# Patient Record
Sex: Female | Born: 1965 | Hispanic: Refuse to answer
Health system: Western US, Academic
[De-identification: ages and names within clinical notes are randomized; demographics above are authoritative.]

## PROBLEM LIST (undated history)

## (undated) HISTORY — PX: HERNIA REPAIR: SHX51

## (undated) HISTORY — PX: THYROID SURGERY: SHX805

## (undated) HISTORY — PX: ROTATOR CUFF REPAIR: SHX139

## (undated) HISTORY — PX: TONSILLECTOMY: SUR1361

---

## 2001-08-21 ENCOUNTER — Encounter: Payer: Self-pay | Admitting: Emergency Medicine

## 2001-08-21 ENCOUNTER — Emergency Department (HOSPITAL_COMMUNITY): Admission: EM | Admit: 2001-08-21 | Discharge: 2001-08-21 | Payer: Self-pay | Admitting: Emergency Medicine

## 2006-04-20 ENCOUNTER — Emergency Department (HOSPITAL_COMMUNITY): Admission: EM | Admit: 2006-04-20 | Discharge: 2006-04-20 | Payer: Self-pay | Admitting: Emergency Medicine

## 2006-12-29 ENCOUNTER — Emergency Department (HOSPITAL_COMMUNITY): Admission: EM | Admit: 2006-12-29 | Discharge: 2006-12-29 | Payer: Self-pay | Admitting: Emergency Medicine

## 2007-07-16 ENCOUNTER — Emergency Department (HOSPITAL_COMMUNITY): Admission: EM | Admit: 2007-07-16 | Discharge: 2007-07-16 | Payer: Self-pay | Admitting: Emergency Medicine

## 2010-12-11 LAB — URINALYSIS, ROUTINE W REFLEX MICROSCOPIC
Bilirubin Urine: NEGATIVE
Glucose, UA: NEGATIVE
Ketones, ur: NEGATIVE
Nitrite: NEGATIVE
Protein, ur: NEGATIVE
Specific Gravity, Urine: 1.016
Urobilinogen, UA: 1
pH: 6

## 2010-12-11 LAB — URINE MICROSCOPIC-ADD ON

## 2010-12-11 LAB — POCT PREGNANCY, URINE
Operator id: 272551
Preg Test, Ur: NEGATIVE

## 2011-03-05 ENCOUNTER — Emergency Department (HOSPITAL_COMMUNITY): Payer: No Typology Code available for payment source

## 2011-03-05 ENCOUNTER — Emergency Department (HOSPITAL_COMMUNITY)
Admission: EM | Admit: 2011-03-05 | Discharge: 2011-03-05 | Disposition: A | Discharge status: Home / Self Care | Payer: No Typology Code available for payment source | Attending: Emergency Medicine | Admitting: Emergency Medicine

## 2011-03-05 DIAGNOSIS — M25562 Pain in left knee: Secondary | ICD-10-CM

## 2011-03-05 DIAGNOSIS — M25569 Pain in unspecified knee: Secondary | ICD-10-CM | POA: Insufficient documentation

## 2011-03-05 MED ORDER — HYDROCODONE-ACETAMINOPHEN 5-325 MG PO TABS: 1.0000 | ORAL_TABLET | Freq: Four times a day (QID) | ORAL | Status: AC | PRN

## 2011-03-05 MED ORDER — NAPROXEN 500 MG PO TABS: 500.0000 mg | ORAL_TABLET | Freq: Two times a day (BID) | ORAL | Status: AC
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
## 2012-02-01 ENCOUNTER — Emergency Department (INDEPENDENT_AMBULATORY_CARE_PROVIDER_SITE_OTHER)
Admission: EM | Admit: 2012-02-01 | Discharge: 2012-02-01 | Disposition: A | Discharge status: Home / Self Care | Payer: Self-pay | Source: Home / Self Care | Attending: Family Medicine | Admitting: Family Medicine

## 2012-02-01 ENCOUNTER — Encounter (HOSPITAL_COMMUNITY): Payer: Self-pay

## 2012-02-01 DIAGNOSIS — H5710 Ocular pain, unspecified eye: Secondary | ICD-10-CM

## 2012-02-01 MED ORDER — TETRACAINE HCL 0.5 % OP SOLN
OPHTHALMIC | Status: AC
  Filled 2012-02-01: qty 2

## 2012-02-01 NOTE — ED Provider Notes (Signed)
History     CSN: [id number]  Arrival date & time 02/01/12  8295   First MD Initiated Contact with Patient 02/01/12 1024      Chief Complaint  Patient presents with  . Eye Problem    (Consider location/radiation/quality/duration/timing/severity/associated sxs/prior treatment) HPI Comments: 46 year old female with no significant past medical history. Here complaining of left eye pain and photophobia since last night. Patient reports that she spent a lot of time yesterday in the computer doing schoolwork and started to develop sensitivity to light, eye tearing and eye pain, you are softer during the evening.  Does not wear contacts. Denies history of glaucoma or eye disease. Denies trauma, injury or history of foreign body. Denies purulent drainage. Patient states she cannot open her eye due to severe pain.    History reviewed. No pertinent past medical history.  Past Surgical History  Procedure Date  . Rotator cuff repair   . Thyroid surgery   . Tonsillectomy   . Hernia repair     No family history on file.  History  Substance Use Topics  . Smoking status: Never Smoker   . Smokeless tobacco: Not on file  . Alcohol Use: No    OB History    Grav Para Term Preterm Abortions TAB SAB Ect Mult Living                  Review of Systems  Constitutional: Negative for fever, chills and appetite change.  HENT: Negative for congestion and rhinorrhea.   Eyes: Positive for photophobia, pain, discharge and redness.  Respiratory: Negative for cough.   Neurological: Negative for dizziness and headaches.  All other systems reviewed and are negative.    Allergies  Review of patient's allergies indicates no known allergies.  Home Medications   Current Outpatient Rx  Name  Route  Sig  Dispense  Refill  . NAPROXEN 500 MG PO TABS   Oral   Take 1 tablet (500 mg total) by mouth 2 (two) times daily.   14 tablet   0     There were no vitals taken for this visit.  Physical  Exam  Nursing note and vitals reviewed. Constitutional: She is oriented to person, place, and time. She appears well-developed and well-nourished.       Unable to open left eye due to pain and photofobia light has been turned off.   HENT:  Head: Normocephalic and atraumatic.  Right Ear: External ear normal.  Left Ear: External ear normal.  Nose: Nose normal.  Mouth/Throat: Oropharynx is clear and moist. No oropharyngeal exudate.  Eyes: No scleral icterus.       Right eye normal. Left eye: severe photophobia, patient refuses to open eye due to pain despite light being dimmed. Persistent tearing. Uncomfortable with minimal eye opening even after tetracaine drops administration.   Reported pain with ocular digital pressure. Miotic reactive pupil but reported painful pupil reaction. Conjunctival erythema that also involves peri cilliar area. No exudates. No ulcers or abrasions on fluorescein stain. No periorbital erythema, edema or fluctuations.    Cardiovascular: Normal heart sounds.   Pulmonary/Chest: Breath sounds normal.  Lymphadenopathy:    She has no cervical adenopathy.  Neurological: She is alert and oriented to person, place, and time.  Skin: No rash noted. She is not diaphoretic.    ED Course  Procedures (including critical care time)  Labs Reviewed - No data to display No results found.   1. Eye pain  MDM  46 year old female here with conjunctival injection including area around iris. Severe photophobia and eye pain even after tetracaine. Possible iritis versus uveitis versus process with increase ocular pressure. Contacted ophthalmologist on call, he is able to see patient in today after discharged from here. Patient agreeable to followup with the ophthalmologist referral/contact information provided.        Sharin Grave, MD 02/03/12 [phone]

## 2012-02-01 NOTE — ED Notes (Signed)
Eye irritation, drainage, sore and swollen, started yesterday., patient states that she is sensitive to light and that she was on the computer all day yesterday doing school work, states that she also had her hair done and that  may be cause as well, tried warm compresses

## 2013-12-19 ENCOUNTER — Emergency Department (HOSPITAL_COMMUNITY): Payer: No Typology Code available for payment source

## 2013-12-19 ENCOUNTER — Emergency Department (HOSPITAL_COMMUNITY)
Admission: EM | Admit: 2013-12-19 | Discharge: 2013-12-19 | Disposition: A | Discharge status: Home / Self Care | Payer: No Typology Code available for payment source | Attending: Emergency Medicine | Admitting: Emergency Medicine

## 2013-12-19 ENCOUNTER — Encounter (HOSPITAL_COMMUNITY): Payer: Self-pay | Admitting: Emergency Medicine

## 2013-12-19 DIAGNOSIS — S4992XA Unspecified injury of left shoulder and upper arm, initial encounter: Secondary | ICD-10-CM | POA: Insufficient documentation

## 2013-12-19 DIAGNOSIS — S39012A Strain of muscle, fascia and tendon of lower back, initial encounter: Secondary | ICD-10-CM | POA: Insufficient documentation

## 2013-12-19 DIAGNOSIS — Y9389 Activity, other specified: Secondary | ICD-10-CM | POA: Diagnosis not present

## 2013-12-19 DIAGNOSIS — Y92410 Unspecified street and highway as the place of occurrence of the external cause: Secondary | ICD-10-CM | POA: Insufficient documentation

## 2013-12-19 DIAGNOSIS — S161XXA Strain of muscle, fascia and tendon at neck level, initial encounter: Secondary | ICD-10-CM | POA: Insufficient documentation

## 2013-12-19 DIAGNOSIS — S199XXA Unspecified injury of neck, initial encounter: Secondary | ICD-10-CM | POA: Diagnosis present

## 2013-12-19 DIAGNOSIS — S6991XA Unspecified injury of right wrist, hand and finger(s), initial encounter: Secondary | ICD-10-CM | POA: Diagnosis not present

## 2013-12-19 DIAGNOSIS — Z3202 Encounter for pregnancy test, result negative: Secondary | ICD-10-CM | POA: Diagnosis not present

## 2013-12-19 DIAGNOSIS — M79644 Pain in right finger(s): Secondary | ICD-10-CM

## 2013-12-19 LAB — POC URINE PREG, ED: PREG TEST UR: NEGATIVE

## 2013-12-19 MED ORDER — OXYCODONE-ACETAMINOPHEN 5-325 MG PO TABS
1.0000 | ORAL_TABLET | Freq: Once | ORAL | Status: AC
  Administered 2013-12-19: 1 via ORAL
  Filled 2013-12-19: qty 1

## 2013-12-19 NOTE — ED Notes (Signed)
Declined W/C at D/C and was escorted to lobby by RN. 

## 2013-12-19 NOTE — Progress Notes (Signed)
Orthopedic Tech Progress Note Patient Details:  Carolyn LimingJeanetta Y Huang Aug 17, 1965 [id number]  Ortho Devices Type of Ortho Device: Thumb velcro splint Ortho Device/Splint Interventions: Application   Haskell Flirtewsome, Serafina Topham M 12/19/2013, 11:36 PM
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
## 2013-12-19 NOTE — Discharge Instructions (Signed)
Please call your doctor for a followup appointment within 24-48 hours. When you talk to your doctor please let them know that you were seen in the emergency department and have them acquire all of your records so that they can discuss the findings with you and formulate a treatment plan to fully care for your new and ongoing problems. Please call and set up an appointment with your primary care provider Please call and set up an appointment with orthopedics Please rest and stay hydrated Please keep wrist in brace at all times for comfort purposes Please massage and apply heat to aid in muscular relief Please continue to monitor symptoms closely and if symptoms are to worsen or change (fever greater than 101, chills, sweating, nausea, vomiting, chest pain, shortness of breathe, difficulty breathing, weakness, numbness, tingling, worsening or changes to pain pattern, fall, injury, inability to control urine or bowel movements) please report back to the Emergency Department immediately.   Cervical Sprain A cervical sprain is when the tissues (ligaments) that hold the neck bones in place stretch or tear. HOME CARE   Put ice on the injured area.  Put ice in a plastic bag.  Place a towel between your skin and the bag.  Leave the ice on for 15-20 minutes, 3-4 times a day.  You may have been given a collar to wear. This collar keeps your neck from moving while you heal.  Do not take the collar off unless told by your doctor.  If you have long hair, keep it outside of the collar.  Ask your doctor before changing the position of your collar. You may need to change its position over time to make it more comfortable.  If you are allowed to take off the collar for cleaning or bathing, follow your doctor's instructions on how to do it safely.  Keep your collar clean by wiping it with mild soap and water. Dry it completely. If the collar has removable pads, remove them every 1-2 days to hand wash them  with soap and water. Allow them to air dry. They should be dry before you wear them in the collar.  Do not drive while wearing the collar.  Only take medicine as told by your doctor.  Keep all doctor visits as told.  Keep all physical therapy visits as told.  Adjust your work station so that you have good posture while you work.  Avoid positions and activities that make your problems worse.  Warm up and stretch before being active. GET HELP IF:  Your pain is not controlled with medicine.  You cannot take less pain medicine over time as planned.  Your activity level does not improve as expected. GET HELP RIGHT AWAY IF:   You are bleeding.  Your stomach is upset.  You have an allergic reaction to your medicine.  You develop new problems that you cannot explain.  You lose feeling (become numb) or you cannot move any part of your body (paralysis).  You have tingling or weakness in any part of your body.  Your symptoms get worse. Symptoms include:  Pain, soreness, stiffness, puffiness (swelling), or a burning feeling in your neck.  Pain when your neck is touched.  Shoulder or upper back pain.  Limited ability to move your neck.  Headache.  Dizziness.  Your hands or arms feel week, lose feeling, or tingle.  Muscle spasms.  Difficulty swallowing or chewing. MAKE SURE YOU:   Understand these instructions.  Will watch your condition.  Will get help right away if you are not doing well or get worse. Document Released: 08/06/2007 Document Revised: 10/20/2012 Document Reviewed: 08/25/2012 [facility] Patient Information 2015 [place], [place]. This information is not intended to replace advice given to you by your health care provider. Make sure you discuss any questions you have with your health care provider.  Lumbosacral Strain Lumbosacral strain is a strain of any of the parts that make up your lumbosacral vertebrae. Your lumbosacral vertebrae are the bones that  make up the lower third of your backbone. Your lumbosacral vertebrae are held together by muscles and tough, fibrous tissue (ligaments).  CAUSES  A sudden blow to your back can cause lumbosacral strain. Also, anything that causes an excessive stretch of the muscles in the low back can cause this strain. This is typically seen when people exert themselves strenuously, fall, lift heavy objects, bend, or crouch repeatedly. RISK FACTORS  Physically demanding work.  Participation in pushing or pulling sports or sports that require a sudden twist of the back (tennis, golf, baseball).  Weight lifting.  Excessive lower back curvature.  Forward-tilted pelvis.  Weak back or abdominal muscles or both.  Tight hamstrings. SIGNS AND SYMPTOMS  Lumbosacral strain may cause pain in the area of your injury or pain that moves (radiates) down your leg.  DIAGNOSIS Your health care provider can often diagnose lumbosacral strain through a physical exam. In some cases, you may need tests such as X-ray exams.  TREATMENT  Treatment for your lower back injury depends on many factors that your clinician will have to evaluate. However, most treatment will include the use of anti-inflammatory medicines. HOME CARE INSTRUCTIONS   Avoid hard physical activities (tennis, racquetball, waterskiing) if you are not in proper physical condition for it. This may aggravate or create problems.  If you have a back problem, avoid sports requiring sudden body movements. Swimming and walking are generally safer activities.  Maintain good posture.  Maintain a healthy weight.  For acute conditions, you may put ice on the injured area.  Put ice in a plastic bag.  Place a towel between your skin and the bag.  Leave the ice on for 20 minutes, 2-3 times a day.  When the low back starts healing, stretching and strengthening exercises may be recommended. SEEK MEDICAL CARE IF:  Your back pain is getting worse.  You  experience severe back pain not relieved with medicines. SEEK IMMEDIATE MEDICAL CARE IF:   You have numbness, tingling, weakness, or problems with the use of your arms or legs.  There is a change in bowel or bladder control.  You have increasing pain in any area of the body, including your belly (abdomen).  You notice shortness of breath, dizziness, or feel faint.  You feel sick to your stomach (nauseous), are throwing up (vomiting), or become sweaty.  You notice discoloration of your toes or legs, or your feet get very cold. MAKE SURE YOU:   Understand these instructions.  Will watch your condition.  Will get help right away if you are not doing well or get worse. Document Released: 11/27/2004 Document Revised: 02/22/2013 Document Reviewed: 10/06/2012 [facility] Patient Information 2015 [place], [place]. This information is not intended to replace advice given to you by your health care provider. Make sure you discuss any questions you have with your health care provider.

## 2013-12-22 NOTE — ED Provider Notes (Signed)
Medical screening examination/treatment/procedure(s) were performed by non-physician practitioner and as supervising physician I was immediately available for consultation/collaboration.   EKG Interpretation None       Nazarene Bunning, MD 12/22/13 0803 

## 2015-12-28 ENCOUNTER — Encounter (HOSPITAL_COMMUNITY): Payer: Self-pay | Admitting: Emergency Medicine

## 2015-12-28 ENCOUNTER — Emergency Department (HOSPITAL_COMMUNITY): Payer: Self-pay

## 2015-12-28 ENCOUNTER — Emergency Department (HOSPITAL_COMMUNITY)
Admission: EM | Admit: 2015-12-28 | Discharge: 2015-12-28 | Disposition: A | Discharge status: Home / Self Care | Payer: Self-pay | Attending: Emergency Medicine | Admitting: Emergency Medicine

## 2015-12-28 DIAGNOSIS — Y9389 Activity, other specified: Secondary | ICD-10-CM | POA: Insufficient documentation

## 2015-12-28 DIAGNOSIS — T148XXA Other injury of unspecified body region, initial encounter: Secondary | ICD-10-CM

## 2015-12-28 DIAGNOSIS — Y999 Unspecified external cause status: Secondary | ICD-10-CM | POA: Insufficient documentation

## 2015-12-28 DIAGNOSIS — Z79899 Other long term (current) drug therapy: Secondary | ICD-10-CM | POA: Insufficient documentation

## 2015-12-28 DIAGNOSIS — S86811A Strain of other muscle(s) and tendon(s) at lower leg level, right leg, initial encounter: Secondary | ICD-10-CM | POA: Insufficient documentation

## 2015-12-28 DIAGNOSIS — X500XXA Overexertion from strenuous movement or load, initial encounter: Secondary | ICD-10-CM | POA: Insufficient documentation

## 2015-12-28 DIAGNOSIS — M79604 Pain in right leg: Secondary | ICD-10-CM

## 2015-12-28 DIAGNOSIS — Y929 Unspecified place or not applicable: Secondary | ICD-10-CM | POA: Insufficient documentation

## 2015-12-28 LAB — CBC WITH DIFFERENTIAL/PLATELET
Basophils Absolute: 0 10*3/uL (ref 0.0–0.1)
Basophils Relative: 1 %
Eosinophils Absolute: 0.2 10*3/uL (ref 0.0–0.7)
Eosinophils Relative: 5 %
HEMATOCRIT: 32 % — AB (ref 36.0–46.0)
HEMOGLOBIN: 9.8 g/dL — AB (ref 12.0–15.0)
LYMPHS ABS: 1.2 10*3/uL (ref 0.7–4.0)
LYMPHS PCT: 32 %
MCH: 26 pg (ref 26.0–34.0)
MCHC: 30.6 g/dL (ref 30.0–36.0)
MCV: 84.9 fL (ref 78.0–100.0)
MONOS PCT: 9 %
Monocytes Absolute: 0.3 10*3/uL (ref 0.1–1.0)
NEUTROS ABS: 1.9 10*3/uL (ref 1.7–7.7)
NEUTROS PCT: 53 %
Platelets: 255 10*3/uL (ref 150–400)
RBC: 3.77 MIL/uL — AB (ref 3.87–5.11)
RDW: 16.3 % — ABNORMAL HIGH (ref 11.5–15.5)
WBC: 3.6 10*3/uL — ABNORMAL LOW (ref 4.0–10.5)

## 2015-12-28 LAB — BASIC METABOLIC PANEL
Anion gap: 5 (ref 5–15)
BUN: 15 mg/dL (ref 6–20)
CHLORIDE: 107 mmol/L (ref 101–111)
CO2: 24 mmol/L (ref 22–32)
CREATININE: 0.88 mg/dL (ref 0.44–1.00)
Calcium: 8.9 mg/dL (ref 8.9–10.3)
GFR calc Af Amer: >60 mL/min (ref >60)
GFR calc non Af Amer: >60 mL/min (ref >60)
Glucose, Bld: 84 mg/dL (ref 65–99)
POTASSIUM: 3.5 mmol/L (ref 3.5–5.1)
Sodium: 136 mmol/L (ref 135–145)

## 2015-12-28 LAB — I-STAT BETA HCG BLOOD, ED (MC, WL, AP ONLY): I-stat hCG, quantitative: <5 m[IU]/mL (ref <5)

## 2015-12-28 MED ORDER — NAPROXEN 250 MG PO TABS
500.0000 mg | ORAL_TABLET | Freq: Once | ORAL | Status: AC
  Administered 2015-12-28: 500 mg via ORAL
  Filled 2015-12-28: qty 2

## 2015-12-28 NOTE — ED Provider Notes (Signed)
[facility]-EMERGENCY DEPT Provider Note   CSN: [id number] Arrival date & time: 12/28/15  1420     History   Chief Complaint Chief Complaint  Patient presents with  . Leg Pain    HPI Carolyn Huang is a 50 y.o. female.  HPI  50 year old female who presents with right lower extremity pain. She is otherwise healthy with history of thyroidectomy and hernia repair. Shee states that for the past several days she has had right lower extremity leg pain. Initially in the back of her heel, but gradually involving her calf and posterior thigh. Pain exacerbated with dorsi and plantar flexion of her ankle. Has noted some mild swelling of the left calf. Recently has been on a 2 hour car ride, but no other prolonged immobilization. No trauma or fall, but states that she is on her feet a lot and ambulates a lot at baseline. He has not taking any medications for her pain. No history or family history of PE/DVT, exogenous hormones, recent surgeries. No overlying skin changes, fevers or chills, chest pain or difficulty breathing, syncope or near syncope.  History reviewed. No pertinent past medical history.  There are no active problems to display for this patient.   Past Surgical History:  Procedure Laterality Date  . HERNIA REPAIR    . ROTATOR CUFF REPAIR    . THYROID SURGERY    . TONSILLECTOMY      OB History    No data available       Home Medications    Prior to Admission medications   Medication Sig Start Date End Date Taking? Authorizing Provider  levothyroxine (SYNTHROID, LEVOTHROID) 175 MCG tablet 1 tablet daily. 11/11/15  Yes Historical Provider, MD    Family History No family history on file.  Social History Social History  Substance Use Topics  . Smoking status: Never Smoker  . Smokeless tobacco: Never Used  . Alcohol use No     Allergies   Review of patient's allergies indicates no known allergies.   Review of Systems Review of Systems 10/14 systems reviewed  and are negative other than those stated in the HPI   Physical Exam Updated Vital Signs BP 146/64 (BP Location: Left Arm)   Pulse 83   Temp 98.3 F (36.8 C) (Oral)   Resp 20   Ht 5\' 3"  (1.6 m)   Wt 170 lb (77.1 kg)   LMP 12/02/2015   SpO2 100%   BMI 30.11 kg/m   Physical Exam Physical Exam  Nursing note and vitals reviewed. Constitutional: Well developed, well nourished, non-toxic, and in no acute distress Head: Normocephalic and atraumatic.  Mouth/Throat: Oropharynx is clear and moist.  Neck: Normal range of motion. Neck supple.  Cardiovascular: Normal rate and regular rhythm.   +2 DP pulses bilaterally Pulmonary/Chest: Effort normal and breath sounds normal.  Abdominal: Soft. There is no tenderness. There is no rebound and no guarding.  Musculoskeletal: Normal range of motion of bilateral lower extremities. No reproducible tenderness to palpation of the calf. No significant soft tissue swelling or overlying skin changes. No deformities.  Neurological: Alert, no facial droop, fluent speech, moves all extremities symmetrically, full strength in hip flexion/extension, knee flexion/extension, and ankle dorsi/plantar flexion bilaterally, sensation to light touch intact bilateral lower extremities Skin: Skin is warm and dry.  Psychiatric: Cooperative   ED Treatments / Results  Labs (all labs ordered are listed, but only abnormal results are displayed) Labs Reviewed  CBC WITH DIFFERENTIAL/PLATELET - Abnormal; Notable for the following:  Result Value   WBC 3.6 (*)    RBC 3.77 (*)    Hemoglobin 9.8 (*)    HCT 32.0 (*)    RDW 16.3 (*)    All other components within normal limits  BASIC METABOLIC PANEL  I-STAT BETA HCG BLOOD, ED ([facility], [facility], [facility] ONLY)    EKG  EKG Interpretation None       Radiology [place] Venous Img Lower Unilateral Right  Result Date: 12/28/2015 CLINICAL DATA:  Right calf and posterior thigh pain for 1 week. EXAM: RIGHT LOWER EXTREMITY VENOUS DOPPLER  ULTRASOUND TECHNIQUE: Gray-scale sonography with graded compression, as well as color Doppler and duplex ultrasound, were performed to evaluate the deep venous system from the level of the common femoral vein through the popliteal and proximal calf veins. Spectral Doppler was utilized to evaluate flow at rest and with distal augmentation maneuvers. COMPARISON:  None. FINDINGS: Left common femoral vein is patent without thrombus. Normal compressibility, augmentation and color Doppler flow in the right common femoral vein, right femoral vein and right popliteal vein. The right saphenofemoral junction is patent. Right profunda femoral vein is patent without thrombus. Visualized right deep calf veins are patent without thrombus. IMPRESSION: Negative for deep venous thrombosis in right lower extremity. Electronically Signed   By: Richarda OverlieAdam  Henn M.D.   On: 12/28/2015 15:52    Procedures Procedures (including critical care time)  Medications Ordered in ED Medications  naproxen (NAPROSYN) tablet 500 mg (500 mg Oral Given 12/28/15 1554)     Initial Impression / Assessment and Plan / ED Course  I have reviewed the triage vital signs and the nursing notes.  Pertinent labs & imaging results that were available during my care of the patient were reviewed by me and considered in my medical decision making (see chart for details).  Clinical Course    50 year old female who presents with right calf and posterior thigh tenderness. Will r/o DVT although low risk. Seems like MSK strain. US of RLE is negative for DVT. No overlying skin changes to suggest cellulitis or infection. Discussed supportive care management for likely muscle strain/musculoskeletal pain. Strict return and follow-up instructions reviewed. She expressed understanding of all discharge instructions and felt comfortable with the plan of care.   Final Clinical Impressions(s) / ED Diagnoses   Final diagnoses:  Right leg pain  Muscle strain     New Prescriptions New Prescriptions   No medications on file     Lavera Guiseana Duo Braedon Sjogren, MD 12/28/15 1655

## 2015-12-28 NOTE — ED Triage Notes (Signed)
Pt c/o rle pain x 1 week. Denies injury. Pt c/o worse pain with flexion and extension of foot. Denies redness/swelling.

## 2015-12-28 NOTE — Discharge Instructions (Signed)
The pain in your leg seems most consistent with muscle strain at this time.  Take anti-inflammatory medications, such as ibuprofen OR naproxen as needed for pain. Keep leg elevated at rest.  Return without fail for worsening symptoms, including fever, escalating pain, inability to walk, or any other symptoms concerning to you.

## 2016-01-06 ENCOUNTER — Encounter (HOSPITAL_COMMUNITY): Payer: Self-pay | Admitting: *Deleted

## 2016-01-06 ENCOUNTER — Emergency Department (HOSPITAL_COMMUNITY)
Admission: EM | Admit: 2016-01-06 | Discharge: 2016-01-06 | Disposition: A | Discharge status: Home / Self Care | Payer: Self-pay | Attending: Emergency Medicine | Admitting: Emergency Medicine

## 2016-01-06 DIAGNOSIS — D649 Anemia, unspecified: Secondary | ICD-10-CM | POA: Insufficient documentation

## 2016-01-06 DIAGNOSIS — M79661 Pain in right lower leg: Secondary | ICD-10-CM | POA: Insufficient documentation

## 2016-01-06 MED ORDER — TRAMADOL HCL 50 MG PO TABS: 50.0000 mg | ORAL_TABLET | Freq: Four times a day (QID) | ORAL | 0 refills | Status: DC | PRN

## 2016-01-06 NOTE — ED Provider Notes (Signed)
[facility]-EMERGENCY DEPT Provider Note   CSN: [id number] Arrival date & time: 01/06/16  1954     History   Chief Complaint Chief Complaint  Patient presents with  . Leg Pain    HPI Carolyn Huang is a 50 y.o. female.  She complains of ongoing pain in her right calf. Pain has been present for about 2 weeks. [facility] and was evaluated with blood work and ultrasound which showed no evidence of blood clot or infection. She was advised to take naproxen or ibuprofen. She is actually been taking both. She continues to have pain in her right calf which is worse with walking. She rates pain at 9/10. She denies fever or chills. She denies any chest pain or dyspnea. She has no risk factors for DVT (no recent surgery, no long distance travel, no history of cancer, no exogenous estrogens). She denies any history of trauma. She says that she has to walk on her toes on the right side because of pain.   The history is provided by the patient.  Leg Pain      History reviewed. No pertinent past medical history.  There are no active problems to display for this patient.   Past Surgical History:  Procedure Laterality Date  . HERNIA REPAIR    . ROTATOR CUFF REPAIR    . THYROID SURGERY    . TONSILLECTOMY      OB History    No data available       Home Medications    Prior to Admission medications   Medication Sig Start Date End Date Taking? Authorizing Provider  levothyroxine (SYNTHROID, LEVOTHROID) 175 MCG tablet 1 tablet daily. 11/11/15   Historical Provider, MD  traMADol (ULTRAM) 50 MG tablet Take 1 tablet (50 mg total) by mouth every 6 (six) hours as needed. 01/06/16   Dione Boozeavid Gladys Deckard, MD    Family History No family history on file.  Social History Social History  Substance Use Topics  . Smoking status: Never Smoker  . Smokeless tobacco: Never Used  . Alcohol use No     Allergies   Patient has no known  allergies.   Review of Systems Review of Systems  Endocrine:       Pagophagia  All other systems reviewed and are negative.    Physical Exam Updated Vital Signs BP 128/90 (BP Location: Left Arm)   Pulse 73   Temp 98.3 F (36.8 C) (Oral)   Resp 18   Ht 5\' 3"  (1.6 m)   Wt 180 lb (81.6 kg)   LMP 12/02/2015   SpO2 99%   BMI 31.89 kg/m   Physical Exam  Nursing note and vitals reviewed.  50 year old female, resting comfortably and in no acute distress. Vital signs are normal. Oxygen saturation is 99%, which is normal. Head is normocephalic and atraumatic. PERRLA, EOMI. Oropharynx is clear. Neck is nontender and supple without adenopathy or JVD. Back is nontender and there is no CVA tenderness. Lungs are clear without rales, wheezes, or rhonchi. Chest is nontender. Heart has regular rate and rhythm without murmur. Abdomen is soft, flat, nontender without masses or hepatosplenomegaly and peristalsis is normoactive. Extremities have no cyanosis or edema, full range of motion is present. There is tenderness to palpation over the right calf with maximum tenderness in the area of the popliteal fossa. There is no swelling or deformity. Calf circumference is equal. Homans sign is negative.  There is no erythema or warmth. Skin is warm and dry without rash. Neurologic: Mental status is normal, cranial nerves are intact, there are no motor or sensory deficits.  ED Treatments / Results   Procedures Procedures (including critical care time)  Medications Ordered in ED Medications - No data to display   Initial Impression / Assessment and Plan / ED Course  I have reviewed the triage vital signs and the nursing notes.  Pertinent labs & imaging results that were available during my care of the patient were reviewed by me and considered in my medical decision making (see chart for details).  Clinical Course    Right calf pain which seems most consistent with ruptured Baker's cyst. Old  records are reviewed confirming ED visit on October 27 at which time venous ultrasound was negative for DVT, and WBC was normal. Incidental finding of anemia. Because of her difficulty with gait, she is given crutches. She is advised to continue taking naproxen but to discontinue ibuprofen. Prescriptions given for tramadol for pain. She is referred to orthopedics for follow-up.  Final Clinical Impressions(s) / ED Diagnoses   Final diagnoses:  Pain in right lower leg  Normochromic normocytic anemia    New Prescriptions New Prescriptions   TRAMADOL (ULTRAM) 50 MG TABLET    Take 1 tablet (50 mg total) by mouth every 6 (six) hours as needed.     Dione Boozeavid Skye Rodarte, MD 01/06/16 2056

## 2016-01-06 NOTE — ED Triage Notes (Signed)
Dr Rosalia Hammersray has  Checked the pt in triage  Maybe swollen rt calf

## 2016-01-06 NOTE — ED Triage Notes (Signed)
The pt is c/o rt lower leg pain since last Thursday  No known injury  [facility] she is still having pain

## 2016-01-06 NOTE — ED Notes (Signed)
Attempted to have pt sign discharge instructions however signature pad not working.  Pt verbalized understanding of discharge instructions and prescriptions.

## 2016-01-06 NOTE — Progress Notes (Signed)
Orthopedic Tech Progress Note Patient Details:  Carolyn Huang 08-11-1965 [id number]  Ortho Devices Type of Ortho Device: Crutches Ortho Device/Splint Interventions: Ordered, Application   Carolyn Huang, Carolyn Huang 01/06/2016, 9:59 PM

## 2016-01-06 NOTE — Discharge Instructions (Signed)
Continue to take naproxen - two tablets, twice a day. Do not take ibuprofen. If you need additional pain medication, take acetaminophen and/or tramadol.  Apply ice several times a day.

## 2017-05-30 IMAGING — US US EXTREM LOW VENOUS*R*
1 series · 14 of 24 positions shown · non-contrast
Comparison: None.

CLINICAL DATA: Right calf and posterior thigh pain for 1 week.

EXAM:
RIGHT LOWER EXTREMITY VENOUS DOPPLER ULTRASOUND
TECHNIQUE: Gray-scale sonography with graded compression, as well as color
Doppler and duplex ultrasound, were performed to evaluate the deep
venous system from the level of the common femoral vein through the
popliteal and proximal calf veins. Spectral Doppler was utilized to
evaluate flow at rest and with distal augmentation maneuvers.

[Series 1: us extrem low venous*right* · 0.06mm/px · 14 of 33 slices shown]
[im 1/33]
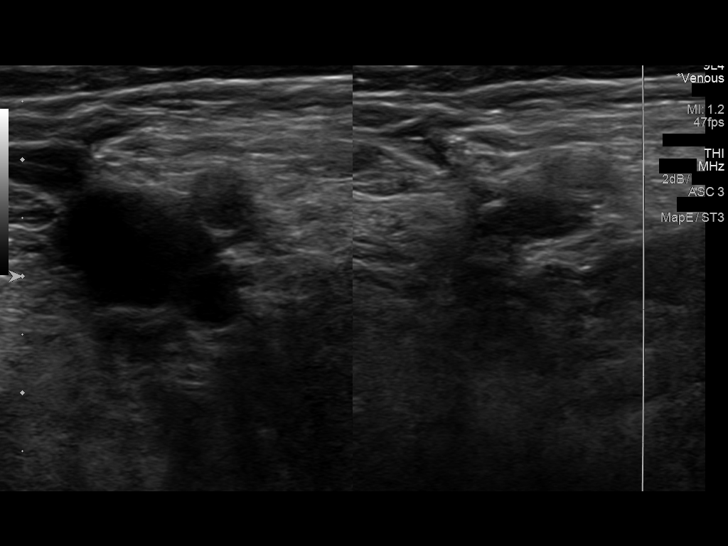
[im 3/33]
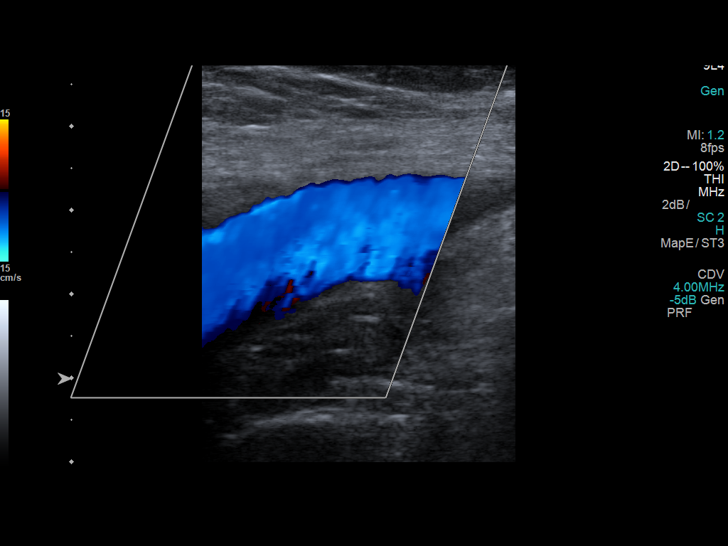
[im 6/33]
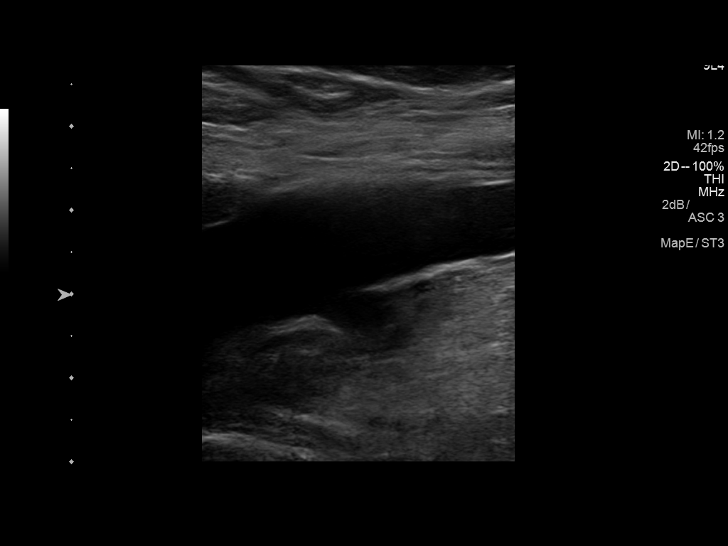
[im 9/33]
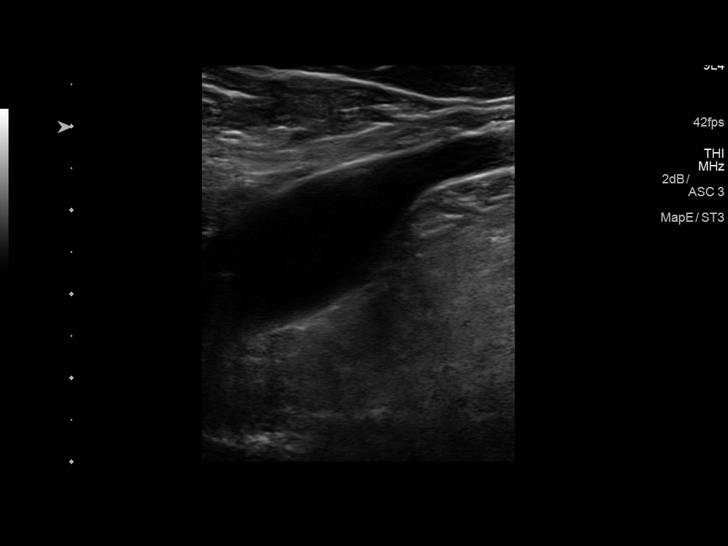
[im 10/33]
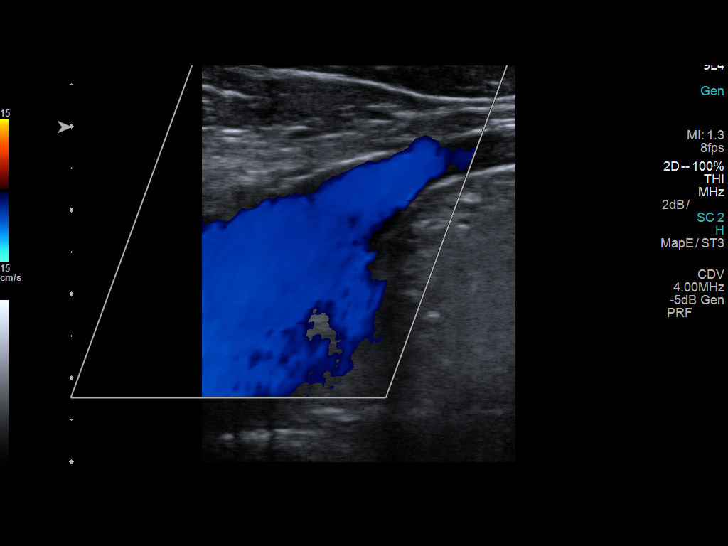
[im 13/33]
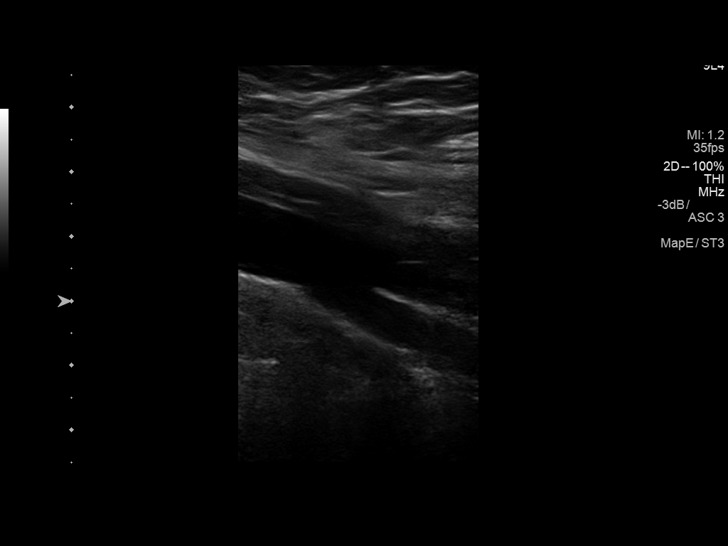
[im 16/33]
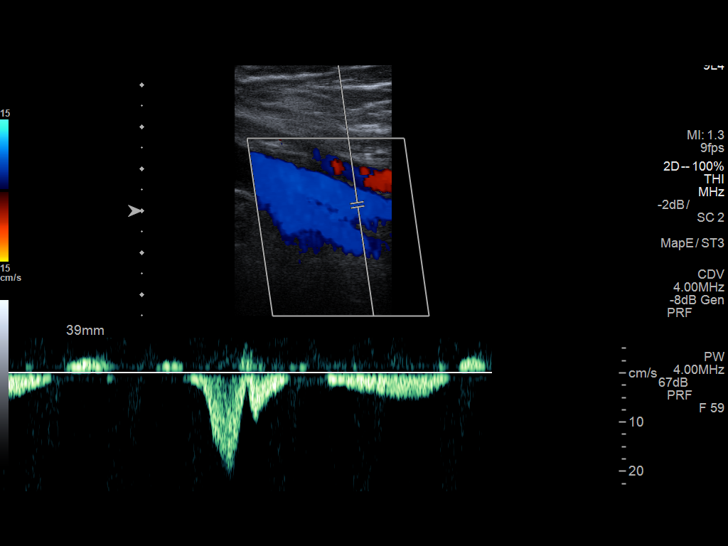
[im 17/33]
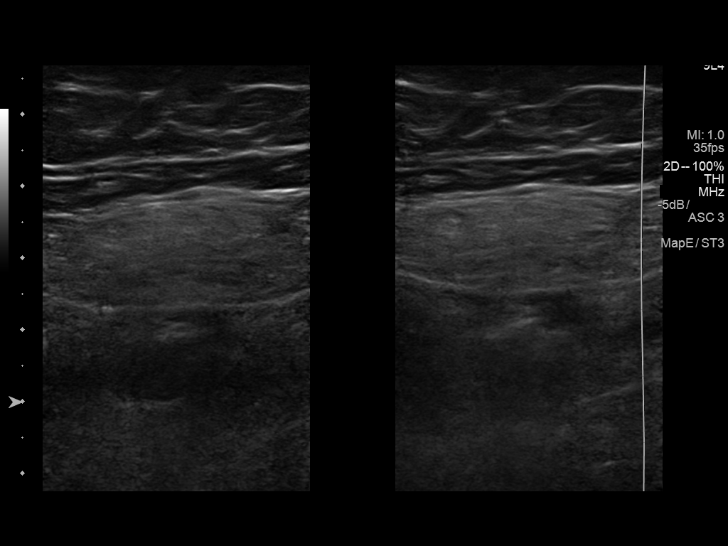
[im 20/33]
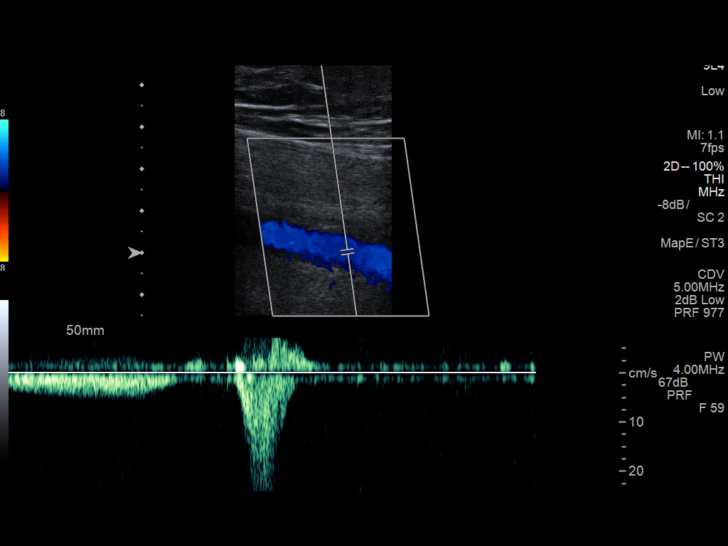
[im 23/33]
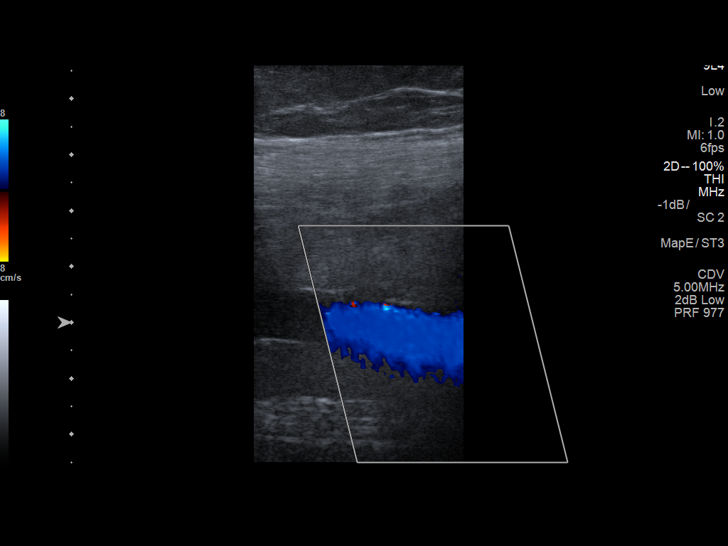
[im 26/33]
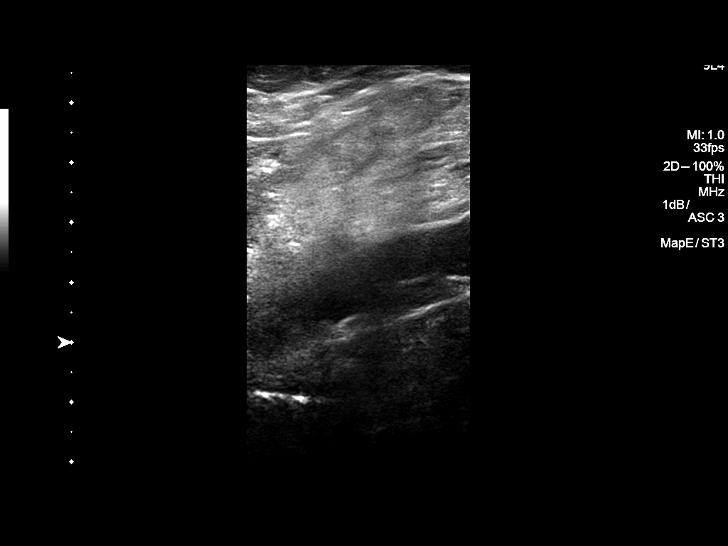
[im 27/33]
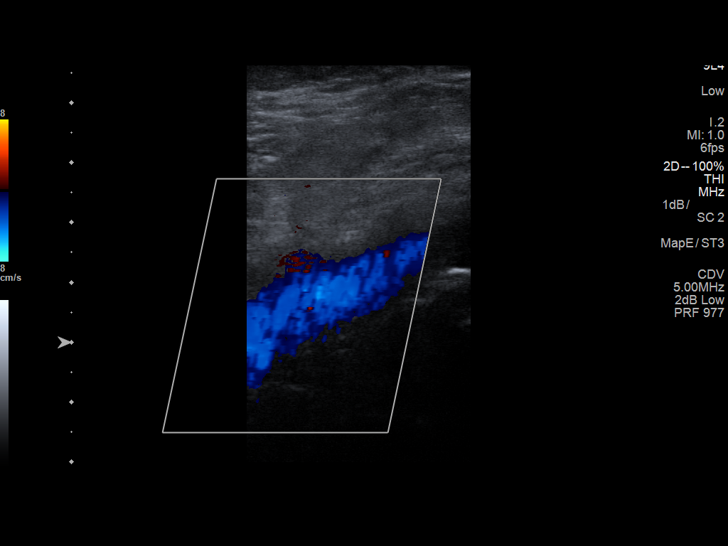
[im 30/33]
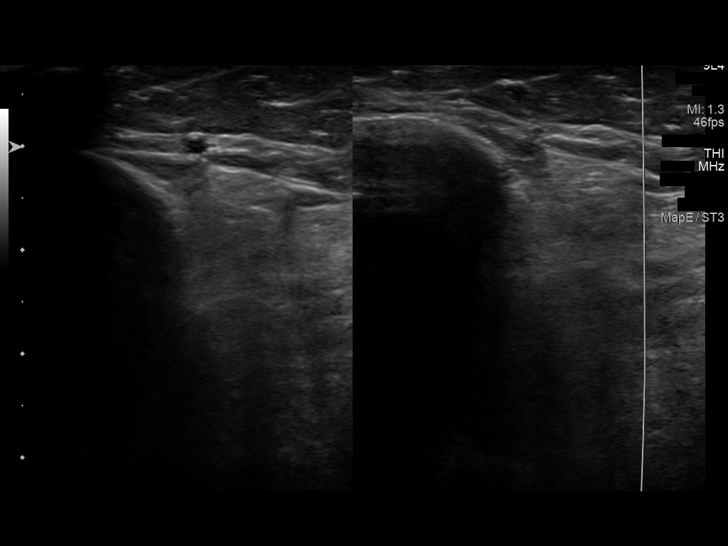
[im 33/33]
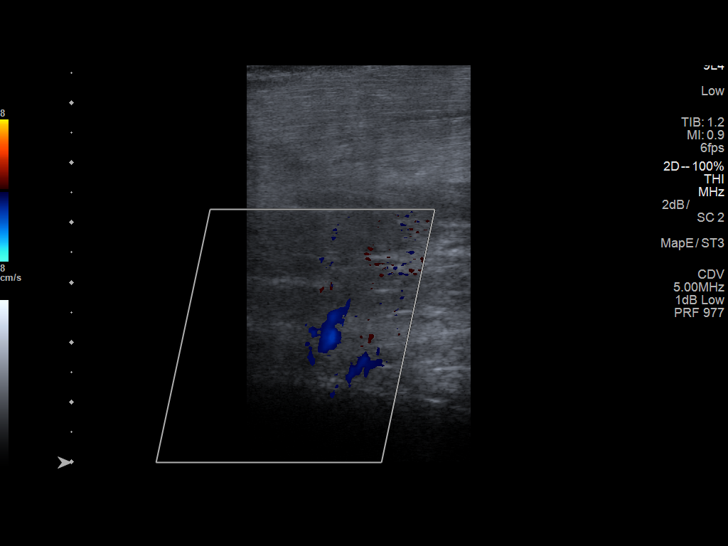

[14 of 24 positions shown; findings below may reference images not displayed]

FINDINGS: Left common femoral vein is patent without thrombus.

Normal compressibility, augmentation and color Doppler flow in the
right common femoral vein, right femoral vein and right popliteal
vein. The right saphenofemoral junction is patent. Right profunda
femoral vein is patent without thrombus. Visualized right deep calf
veins are patent without thrombus.
IMPRESSION: Negative for deep venous thrombosis in right lower extremity.

## 2017-12-06 ENCOUNTER — Emergency Department (HOSPITAL_COMMUNITY)
Admission: EM | Admit: 2017-12-06 | Discharge: 2017-12-07 | Disposition: A | Discharge status: Home / Self Care | Payer: BLUE CROSS/BLUE SHIELD | Attending: Emergency Medicine | Admitting: Emergency Medicine

## 2017-12-06 ENCOUNTER — Emergency Department (HOSPITAL_COMMUNITY): Payer: BLUE CROSS/BLUE SHIELD

## 2017-12-06 ENCOUNTER — Encounter (HOSPITAL_COMMUNITY): Payer: Self-pay | Admitting: Emergency Medicine

## 2017-12-06 DIAGNOSIS — E039 Hypothyroidism, unspecified: Secondary | ICD-10-CM | POA: Diagnosis not present

## 2017-12-06 DIAGNOSIS — R1084 Generalized abdominal pain: Secondary | ICD-10-CM | POA: Diagnosis present

## 2017-12-06 DIAGNOSIS — N946 Dysmenorrhea, unspecified: Secondary | ICD-10-CM

## 2017-12-06 DIAGNOSIS — Z79899 Other long term (current) drug therapy: Secondary | ICD-10-CM | POA: Insufficient documentation

## 2017-12-06 LAB — CBC
HCT: 31.9 % — ABNORMAL LOW (ref 36.0–46.0)
HEMOGLOBIN: 9.4 g/dL — AB (ref 12.0–15.0)
MCH: 25 pg — ABNORMAL LOW (ref 26.0–34.0)
MCHC: 29.5 g/dL — ABNORMAL LOW (ref 30.0–36.0)
MCV: 84.8 fL (ref 78.0–100.0)
PLATELETS: 322 10*3/uL (ref 150–400)
RBC: 3.76 MIL/uL — AB (ref 3.87–5.11)
RDW: 16 % — ABNORMAL HIGH (ref 11.5–15.5)
WBC: 5.5 10*3/uL (ref 4.0–10.5)

## 2017-12-06 LAB — COMPREHENSIVE METABOLIC PANEL
ALT: 9 U/L (ref 0–44)
AST: 14 U/L — AB (ref 15–41)
Albumin: 3.4 g/dL — ABNORMAL LOW (ref 3.5–5.0)
Alkaline Phosphatase: 91 U/L (ref 38–126)
Anion gap: 8 (ref 5–15)
BUN: 12 mg/dL (ref 6–20)
CALCIUM: 8.8 mg/dL — AB (ref 8.9–10.3)
CHLORIDE: 105 mmol/L (ref 98–111)
CO2: 23 mmol/L (ref 22–32)
CREATININE: 0.91 mg/dL (ref 0.44–1.00)
GFR calc non Af Amer: >60 mL/min (ref >60)
Glucose, Bld: 115 mg/dL — ABNORMAL HIGH (ref 70–99)
POTASSIUM: 3.6 mmol/L (ref 3.5–5.1)
SODIUM: 136 mmol/L (ref 135–145)
TOTAL PROTEIN: 7 g/dL (ref 6.5–8.1)
Total Bilirubin: 0.7 mg/dL (ref 0.3–1.2)

## 2017-12-06 LAB — I-STAT BETA HCG BLOOD, ED (MC, WL, AP ONLY)

## 2017-12-06 LAB — LIPASE, BLOOD: LIPASE: 26 U/L (ref 11–51)

## 2017-12-06 MED ORDER — IOHEXOL 300 MG/ML  SOLN
100.0000 mL | Freq: Once | INTRAMUSCULAR | Status: AC | PRN
  Administered 2017-12-06: 100 mL via INTRAVENOUS

## 2017-12-06 MED ORDER — KETOROLAC TROMETHAMINE 15 MG/ML IJ SOLN
15.0000 mg | Freq: Once | INTRAMUSCULAR | Status: AC
  Administered 2017-12-06: 15 mg via INTRAVENOUS
  Filled 2017-12-06: qty 1

## 2017-12-06 NOTE — ED Notes (Signed)
ED Provider at bedside. 

## 2017-12-06 NOTE — ED Triage Notes (Signed)
Pt reports gen abd pain X few hrs. Cramping,sharp, 10/10. Reports episode of diarrhea, denies N/V.

## 2017-12-06 NOTE — ED Provider Notes (Signed)
[facility] EMERGENCY DEPARTMENT Provider Note   CSN: [id number] Arrival date & time: 12/06/17  2148     History   Chief Complaint Chief Complaint  Patient presents with  . Abdominal Pain    HPI Carolyn Huang is a 52 y.o. female with medical history significant for hypothyroidism and anemia presenting for evaluation of abdominal pain.  She was in her usual state of health until about 8 PM when she experienced an acute onset diffuse abdominal pain, rating the pain at 10/10 and describing the pain as " labor pain" she feels like she is having a baby.  She also reports a one-time episode of loose stools but denies nausea, vomiting, fever, chills.  She does state that she began her menstruation this morning but usually does not experience cramps.  She also denies any change in her diet and her last meal was 12 PM when she ate 2 pieces of fish.  She also denies dysuria, urinary frequency or urgency.    History reviewed. No pertinent past medical history.  There are no active problems to display for this patient.   Past Surgical History:  Procedure Laterality Date  . HERNIA REPAIR    . ROTATOR CUFF REPAIR    . THYROID SURGERY    . TONSILLECTOMY       OB History   None      Home Medications    Prior to Admission medications   Medication Sig Start Date End Date Taking? Authorizing Provider  levothyroxine (SYNTHROID, LEVOTHROID) 200 MCG tablet Take 200 mcg by mouth daily. 12/01/17  Yes [provider]  acetaminophen (TYLENOL) 325 MG tablet Take 2 tablets (650 mg total) by mouth every 6 (six) hours as needed for up to 10 days. 12/07/17 12/17/17  Yvette Rack, MD  ibuprofen (ADVIL,MOTRIN) 400 MG tablet Take 1 tablet (400 mg total) by mouth every 6 (six) hours as needed. 12/07/17   Yvette Rack, MD  traMADol (ULTRAM) 50 MG tablet Take 1 tablet (50 mg total) by mouth every 6 (six) hours as needed. Patient not taking: Reported on 12/06/2017 01/06/16    Dione Booze, MD    Family History No family history on file.  Social History Social History   Tobacco Use  . Smoking status: Never Smoker  . Smokeless tobacco: Never Used  Substance Use Topics  . Alcohol use: No  . Drug use: No     Allergies   Patient has no known allergies.   Review of Systems Review of Systems  Constitutional: Negative.   HENT: Negative.   Eyes: Negative.   Respiratory: Negative.   Cardiovascular: Negative.   Gastrointestinal: Positive for abdominal pain. Negative for blood in stool, constipation, diarrhea, nausea and vomiting.  Endocrine: Negative.   Genitourinary: Negative.   Musculoskeletal: Negative.   Neurological: Negative.   Psychiatric/Behavioral: Negative.      Physical Exam Updated Vital Signs BP (!) 126/54   Pulse 64   Temp 97.8 F (36.6 C) (Oral)   Resp 16   LMP 12/06/2017   SpO2 98%   Physical Exam  Constitutional: She appears well-developed and well-nourished.  Non-toxic appearance. She does not appear ill. She appears distressed.  HENT:  Head: Normocephalic and atraumatic.  Cardiovascular: Normal rate, regular rhythm and normal heart sounds. Exam reveals no gallop and no friction rub.  No murmur heard. Pulmonary/Chest: Effort normal and breath sounds normal.  Abdominal: Soft. Normal appearance and bowel sounds are normal. She exhibits no distension.  There is generalized tenderness and tenderness in the epigastric area and suprapubic area. There is no rigidity, no rebound, no guarding and no CVA tenderness.  Neurological: She is alert.  Skin: Skin is warm.  Psychiatric: She has a normal mood and affect. Her behavior is normal.     ED Treatments / Results  Labs (all labs ordered are listed, but only abnormal results are displayed) Labs Reviewed  COMPREHENSIVE METABOLIC PANEL - Abnormal; Notable for the following components:      Result Value   Glucose, Bld 115 (*)    Calcium 8.8 (*)    Albumin 3.4 (*)    AST 14  (*)    All other components within normal limits  CBC - Abnormal; Notable for the following components:   RBC 3.76 (*)    Hemoglobin 9.4 (*)    HCT 31.9 (*)    MCH 25.0 (*)    MCHC 29.5 (*)    RDW 16.0 (*)    All other components within normal limits  LIPASE, BLOOD  URINALYSIS, ROUTINE W REFLEX MICROSCOPIC  I-STAT BETA HCG BLOOD, ED ([facility], [facility], [facility] ONLY)    EKG None  Radiology Ct Abdomen Pelvis W Contrast  Result Date: 12/07/2017 CLINICAL DATA:  Generalized abdominal pain and diarrhea. EXAM: CT ABDOMEN AND PELVIS WITH CONTRAST TECHNIQUE: Multidetector CT imaging of the abdomen and pelvis was performed using the standard protocol following bolus administration of intravenous contrast. CONTRAST:  OMNIPAQUE IOHEXOL 300 MG/ML  SOLN COMPARISON:  None. FINDINGS: Lower chest: No consolidation. Minimal pleural thickening without effusion. Hepatobiliary: No focal liver abnormality is seen. No gallstones, gallbladder wall thickening, or biliary dilatation. Pancreas: 14 mm fat density lesion in the age in the uncinate process of the pancreas may be a pancreatic lipoma or invagination of peripancreatic fat. No suspicious lesion. No ductal dilatation or inflammation. Spleen: Normal in size without focal abnormality. Adrenals/Urinary Tract: Normal adrenal glands. No hydronephrosis or perinephric edema. Homogeneous renal enhancement with symmetric excretion on delayed phase imaging. Urinary bladder is minimally distended, grossly unremarkable. Stomach/Bowel: The stomach is nondistended. No small bowel wall thickening, inflammatory change or obstruction. Formed stool in the ascending colon. The descending and sigmoid colon are nondistended limiting assessment. No definite colonic wall thickening. Regardless, no pericolonic inflammation. Appendix is not confidently visualized. No pericecal or right lower quadrant inflammation. Vascular/Lymphatic: Unremarkable vascular structures. No enlarged abdominal or  pelvic lymph nodes. Reproductive: 15 mm cyst in the left ovary is likely physiologic. Uterus and right ovary are unremarkable. Tampon in the vagina. Other: Small amount of pelvic free fluid. No free air or intra-abdominal abscess. Musculoskeletal: There are no acute or suspicious osseous abnormalities. Facet hypertrophy in the lower lumbar spine. IMPRESSION: No acute findings in the abdomen/pelvis or explanation for diarrhea. The descending and sigmoid colon are decompressed slightly limiting assessment, however no colonic inflammation. Electronically Signed   By: Narda Rutherford M.D.   On: 12/07/2017 00:13    Procedures Procedures (including critical care time)  Medications Ordered in ED Medications  ketorolac (TORADOL) 15 MG/ML injection 15 mg (15 mg Intravenous Given 12/06/17 2342)  iohexol (OMNIPAQUE) 300 MG/ML solution 100 mL (100 mLs Intravenous Contrast Given 12/06/17 2347)     Initial Impression / Assessment and Plan / ED Course  I have reviewed the triage vital signs and the nursing notes.  Pertinent labs & imaging results that were available during my care of the patient were reviewed by me and considered in my medical decision making (see chart  for details).   52 year old woman presented with acute diffuse abdominal pain, describes the pain as cramps in nature.  Denies fevers, chills, nausea, vomiting but does report one episode of loose stools.  On physical exam there is tenderness at the epigastrium and lower abdomen.  She was re-evaluated after receiving IM Toradol 15mg  and reported of significant relief.  Differential diagnosis for this lady includes diverticulitis, colitis, gastroenteritis or dysmenorrhea.  Currently she has no signs of acute abdomen. CT abdomen and pelvis was unremarkable, she remains hemodynamically stable with normal lipase, beta-hCG, CMP.  Final Clinical Impressions(s) / ED Diagnoses   Final diagnoses:  Generalized abdominal pain    ED Discharge  Orders         Ordered    acetaminophen (TYLENOL) 325 MG tablet  Every 6 hours PRN     12/07/17 0020    ibuprofen (ADVIL,MOTRIN) 400 MG tablet  Every 6 hours PRN     12/07/17 0020           Yvette Rack, MD 12/07/17 Durenda Age, MD 12/07/17 0025

## 2017-12-06 NOTE — ED Notes (Signed)
Patient transported to CT 

## 2017-12-07 LAB — URINALYSIS, ROUTINE W REFLEX MICROSCOPIC
BILIRUBIN URINE: NEGATIVE
Glucose, UA: NEGATIVE mg/dL
Ketones, ur: NEGATIVE mg/dL
Leukocytes, UA: NEGATIVE
NITRITE: NEGATIVE
Protein, ur: NEGATIVE mg/dL
SPECIFIC GRAVITY, URINE: 1.02 (ref 1.005–1.030)
pH: 5 (ref 5.0–8.0)

## 2017-12-07 MED ORDER — ACETAMINOPHEN 325 MG PO TABS
650.0000 mg | ORAL_TABLET | Freq: Four times a day (QID) | ORAL | 0 refills | Status: AC | PRN
Start: 2017-12-07 — End: 2017-12-17

## 2017-12-07 MED ORDER — IBUPROFEN 400 MG PO TABS: 400.0000 mg | ORAL_TABLET | Freq: Four times a day (QID) | ORAL | 0 refills | Status: AC | PRN

## 2017-12-07 NOTE — Discharge Instructions (Addendum)
Ms. Caffee,   It was a pleasure taking care of you here at the ED today.  So far all your labs have come back normal and a CT scan took of your bili was normal.  Your pain is most likely due to cramps from your ongoing menstruation and I will discharge you with Tylenol and Motrin.  I would like for you to follow-up with your primary care doctor but if your symptoms start worsening if he start getting lightheaded, dizzy sensation of passing out, abdominal pain that is out of control please report to the ED.  ~Take Care Dr. Dortha Schwalbe

## 2018-01-31 ENCOUNTER — Ambulatory Visit
Admit: 2018-01-31 | Discharge: 2018-01-31 | Disposition: A | Discharge status: Home / Self Care | Payer: Medicaid Other | Source: Other Acute Inpatient Hospital

## 2018-02-01 NOTE — Progress Notes (Signed)
HOSPITAL MEDICINE OUTSIDE HOSPITAL TRANSFER NOTE    Accept Date: 02/01/18    Referring Provider Name: Dr. Neil CrouchWinn  Referring Provider Contact #: [phone]    Referring Facility/Clinic: [facility]    Dx: Vaginal bleeding with DVT found to have uterine mass  Reason for Admit/Transfer: Gyn/Onc  Level of Care: IMU    Brief History:   28F with psychiatric past medical history who [facility] to [facility] found to have DVT and was started on heparin gtt. Hospital course was then complicated by vaginal bleeding and a CT abdomen/pelvis was performed that showed peritoneal mass with query of lung metastases (CT chest was not performed as patient refused). She required FFP, pRBCs, and IVC filter. She experienced episodes vaginal bleeding requiring recurrent transfusion. Gynecology was consulted and recommended transfer for gyn/onc evaluation. Inguinal LN biopsy was performed 4-5 days ago pending pathology results. Some thought that uterine bleeding could be related to fibroids as well. Gynecology recommend transfer for GYN/ONC evaluation. GYN/ONC deferred acceptance as pathology was pending. Patient currently in the ICU and will be downgraded today per primary team. I have deferred accepting the patient at this time. since the patient is still actively in the ICU and would like to ensure that patient can remain stable on the floor for several hours. Also, have asked for primary team to follow up on pathology results as there should be some preliminary data and which patient could directly go to GYN/ONC service.    Patient is not accepted for transfer at this time as above. I have asked the primary team and the transfer center to reach out to us again tomorrow after the above is followed up on.    Desmond LopeNhan Fumi Guadron MD  Division of Hospital Medicine  Pager: [phone]

## 2018-02-06 NOTE — Progress Notes (Addendum)
HOSPITAL MEDICINE OUTSIDE HOSPITAL TRANSFER NOTE    Accept Date: 02/06/18    Referring Provider Name: Dr. Phillips ClimesShyam Patel  Referring Provider Contact #: [phone]    [facility]    Dx: Vaginal bleeding from uterine mass, requiring further workup to r/o malignancy and requiring multiple transfusions  Reason for Admit/Transfer: Gyn/Onc  Level of Care: IMU    Brief History:   69F with hx of homelessness, bipolar, and schizoaffective disorder (stable), being transferred for evaluation of multiple abdominal and lung masses and consolidation. Has an acute R common femoral vein DVT, started on heparin gtt and transitioned to therapeutic Lovenox. However, noted that her baseline anemia was worse, thought to be due to menstrual bleeding. Required multiple blood transfusions. 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 Given this, she had an IVC filter placed and has not been on therapeutic anticoagulation. Last blood transfusion was yesterday. Today's Hgb 7.6. began having vaginal spotting again 2 days ago, leading to a drop her Hgb that then required transfusion. Their Gynecologist recommended Gyn/Onc evaluate the patient. S/p L inguinal node biopsy on 02/01/2018 but was non-diagnostic for malignancy (resulted on 02/04/2018). Has not had a uterine biopsy performed yet, due to scheduling issues.    Currently, pt is on Med-Tele and is hemodynamically stable. Recommended the Transfer Center contact Gyn-Onc for possible transfer, given that the main etiology of her recurrent anemia (requiring transfusions) is likely her uterine mass--which I communicated with the requesting outside physician. Recommend that outside facility obtain uterine biopsy to r/o malignancy and that outside facility consider IR for embolization of uterine mass, if pt continues to bleed. Hospital Medicine is not accepting this patient.    Haley DawnMaryann Leoma Folds, MD, MPH  Hospitalist  Attending

## 2018-05-26 ENCOUNTER — Emergency Department (HOSPITAL_COMMUNITY)
Admission: EM | Admit: 2018-05-26 | Discharge: 2018-05-26 | Disposition: A | Discharge status: Home / Self Care | Payer: PRIVATE HEALTH INSURANCE | Attending: Emergency Medicine | Admitting: Emergency Medicine

## 2018-05-26 ENCOUNTER — Encounter (HOSPITAL_COMMUNITY): Payer: Self-pay | Admitting: Emergency Medicine

## 2018-05-26 ENCOUNTER — Emergency Department (HOSPITAL_COMMUNITY): Payer: PRIVATE HEALTH INSURANCE

## 2018-05-26 ENCOUNTER — Other Ambulatory Visit: Payer: Self-pay

## 2018-05-26 DIAGNOSIS — R69 Illness, unspecified: Secondary | ICD-10-CM

## 2018-05-26 DIAGNOSIS — R05 Cough: Secondary | ICD-10-CM | POA: Diagnosis present

## 2018-05-26 DIAGNOSIS — E039 Hypothyroidism, unspecified: Secondary | ICD-10-CM | POA: Insufficient documentation

## 2018-05-26 DIAGNOSIS — J111 Influenza due to unidentified influenza virus with other respiratory manifestations: Secondary | ICD-10-CM | POA: Insufficient documentation

## 2018-05-26 MED ORDER — ACETAMINOPHEN 325 MG PO TABS
650.0000 mg | ORAL_TABLET | Freq: Once | ORAL | Status: AC | PRN
  Administered 2018-05-26: 650 mg via ORAL
  Filled 2018-05-26: qty 2

## 2018-05-26 NOTE — ED Provider Notes (Signed)
Emergency Department Provider Note   I have reviewed the triage vital signs and the nursing notes.   HISTORY  Chief Complaint Cough and Fever   HPI Carolyn Huang is a 53 y.o. female with PMH of hypothyroidism presents to the ED with cough and fever.  Patient has had 2 days of symptoms.  She denies chest pain or shortness of breath.  She notes people with mild URI symptoms at work but no known sick contacts.  Specifically no COVID contacts. No travel.  Any history of COPD or congestive heart failure.  She was unaware of the fever until it was identified today.  She had one episode of vomiting 4 days ago but that is resolved.  No abdominal pain or diarrhea.  She does have some nasal congestion.  No body aches.  History reviewed. No pertinent past medical history.  There are no active problems to display for this patient.   Past Surgical History:  Procedure Laterality Date  . HERNIA REPAIR    . ROTATOR CUFF REPAIR    . THYROID SURGERY    . TONSILLECTOMY     Allergies Patient has no known allergies.  History reviewed. No pertinent family history.  Social History Social History   Tobacco Use  . Smoking status: Never Smoker  . Smokeless tobacco: Never Used  Substance Use Topics  . Alcohol use: No  . Drug use: No    Review of Systems  Constitutional: Positive fever.  Eyes: No visual changes. ENT: No sore throat. Cardiovascular: Denies chest pain. Respiratory: Denies shortness of breath. Positive cough.  Gastrointestinal: No abdominal pain.  No nausea, no vomiting.  No diarrhea.  No constipation. Genitourinary: Negative for dysuria. Musculoskeletal: Negative for back pain. Skin: Negative for rash. Neurological: Negative for headaches, focal weakness or numbness.  10-point ROS otherwise negative.  ____________________________________________   PHYSICAL EXAM:  VITAL SIGNS: ED Triage Vitals  Enc Vitals Group     BP 05/26/18 1930 139/64     Pulse Rate  05/26/18 1930 97     Resp 05/26/18 1930 20     Temp 05/26/18 1930 (!) 102.5 F (39.2 C)     Temp Source 05/26/18 1930 Oral     SpO2 05/26/18 1930 100 %     Weight 05/26/18 1931 195 lb (88.5 kg)     Height 05/26/18 1931 5\' 3"  (1.6 m)     Pain Score 05/26/18 1931 0   Constitutional: Alert and oriented. Well appearing and in no acute distress. Eyes: Conjunctivae are normal. Head: Atraumatic. Nose: No congestion/rhinnorhea. Mouth/Throat: Mucous membranes are moist.  Neck: No stridor.   Cardiovascular: Normal rate, regular rhythm. Good peripheral circulation. Grossly normal heart sounds.   Respiratory: Normal respiratory effort.  No retractions. Lungs CTAB. Frequent coughing.  Gastrointestinal: Soft and nontender. No distention.  Musculoskeletal: No lower extremity tenderness nor edema. No gross deformities of extremities. Neurologic:  Normal speech and language. No gross focal neurologic deficits are appreciated.  Skin:  Skin is warm, dry and intact. No rash noted.  ____________________________________________  RADIOLOGY  Dg Chest Portable 1 View  Result Date: 05/26/2018 CLINICAL DATA:  Initial evaluation for acute cough, fever. EXAM: PORTABLE CHEST 1 VIEW COMPARISON:  Prior radiograph from 12/19/2013. FINDINGS: Transverse heart size within normal limits. Mediastinal silhouette normal. Lungs normally inflated. No focal infiltrates. No pulmonary edema or pleural effusion. No pneumothorax. No acute osseous finding. IMPRESSION: No radiographic evidence for active cardiopulmonary disease. Electronically Signed   By: Janell Quiet.D.  On: 05/26/2018 20:19    ____________________________________________   PROCEDURES  Procedure(s) performed:   Procedures  None  ____________________________________________   INITIAL IMPRESSION / ASSESSMENT AND PLAN / ED COURSE  Pertinent labs & imaging results that were available during my care of the patient were reviewed by me and  considered in my medical decision making (see chart for details).  Patient with mild upper respiratory tract infection symptoms with cough.  She is febrile here.  No tachycardia or hypoxemia.  She appears well and has no increased work of breathing.  Suspect flulike illness with possible COVID.  Have placed the patient on both contact and droplet precautions. Plan for CXR and likely discharge with self quarantine plan.   08:30 PM  Patient feeling better after Tylenol.  No respiratory distress or hypoxemia.  Chest x-ray reviewed with no infiltrate.  I discussed with the patient that she likely has a viral infection, possibly flu, possibly COVID.  She is well and not requiring hospitalization.  I discussed that she will need to self quarantine for the next 10 days.  I provided this in writing as well along with a work note.  Patient verbalized understanding with this recommendation and plans to do so.  I specifically discussed limiting exposure to the elderly and/or immune compromise.  [facility] emergency department return precautions and fever/cough management at home.  ____________________________________________  FINAL CLINICAL IMPRESSION(S) / ED DIAGNOSES  Final diagnoses:  Influenza-like illness    MEDICATIONS GIVEN DURING THIS VISIT:  Medications  acetaminophen (TYLENOL) tablet 650 mg (650 mg Oral Given 05/26/18 1939)    Note:  This document was prepared using Dragon voice recognition software and may include unintentional dictation errors.  Alona Bene, MD Emergency Medicine    , Arlyss Repress, MD 05/26/18 2035

## 2018-05-26 NOTE — Discharge Instructions (Addendum)
You were seen today with flu-like illness. Your chest x-ray was normal. You are contagious and need to stay home and limit contact with others for the next 10 days. I have provided a work note to that effect. Call your PCP to discuss your symptoms by phone. You can call a Tele-health provider and see a doctor on line if your symptoms worsen slightly.   Return to the ED with any chest pain, trouble breathing, confusion, or other severe symptoms.

## 2018-05-26 NOTE — ED Triage Notes (Signed)
Patient says she has cough that started yesterday, current temperature 102.5.

## 2018-06-10 ENCOUNTER — Telehealth: Payer: Self-pay | Admitting: Physician Assistant

## 2018-06-10 DIAGNOSIS — J069 Acute upper respiratory infection, unspecified: Secondary | ICD-10-CM

## 2018-06-10 DIAGNOSIS — R059 Cough, unspecified: Secondary | ICD-10-CM

## 2018-06-10 DIAGNOSIS — R05 Cough: Secondary | ICD-10-CM

## 2018-06-10 MED ORDER — PROMETHAZINE-DM 6.25-15 MG/5ML PO SYRP: 5.0000 mL | ORAL_SOLUTION | Freq: Four times a day (QID) | ORAL | 0 refills | Status: AC | PRN

## 2018-06-10 MED ORDER — BENZONATATE 100 MG PO CAPS: 100.0000 mg | ORAL_CAPSULE | Freq: Three times a day (TID) | ORAL | 0 refills | Status: DC | PRN

## 2018-06-10 NOTE — Progress Notes (Signed)
E-Visit for Corona Virus Screening  Based on your current symptoms, it seems unlikely that your symptoms are related to the Coronavirus.   Coronavirus disease 2019 (COVID-19) is a respiratory illness that can spread from person to person. The virus that causes COVID-19 is a new virus that was first identified in the country of [place] but is now found in multiple other countries and has spread to the [place].  Symptoms associated with the virus are mild to severe fever, cough, and shortness of breath. There is currently no vaccine to protect against COVID-19, and there is no specific antiviral treatment for the virus.   To be considered HIGH RISK for Coronavirus (COVID-19), you have to meet the following criteria:  . Traveled to [place], [place], [place], [place] or [place]; or in the [place] to [place], [place], [place], or [place]; and have fever, cough, and shortness of breath within the last 2 weeks of travel OR  . Been in close contact with a person diagnosed with COVID-19 within the last 2 weeks and have fever, cough, and shortness of breath  . IF YOU DO NOT MEET THESE CRITERIA, YOU ARE CONSIDERED LOW RISK FOR COVID-19.   It is vitally important that if you feel that you have an infection such as this virus or any other virus that you stay home and away from places where you may spread it to others.  You should self-quarantine for 14 days if you have symptoms that could potentially be coronavirus and avoid contact with people age 8 and older.   You can use medication such as A prescription cough medication called Tessalon Perles 100 mg. You may take 1-2 capsules every 8 hours as needed for cough and A prescription cough medication called Phenergan DM 6.25 mg/15 mg. You make take one teaspoon / 5 ml every 4-6 hours as needed for cough  You may also take acetaminophen (Tylenol) as needed for fever.   Reduce your risk of any infection by using the same precautions used for  avoiding the common cold or flu:  Marland Kitchen Wash your hands often with soap and warm water for at least 20 seconds.  If soap and water are not readily available, use an alcohol-based hand sanitizer with at least 60% alcohol.  . If coughing or sneezing, cover your mouth and nose by coughing or sneezing into the elbow areas of your shirt or coat, into a tissue or into your sleeve (not your hands). . Avoid shaking hands with others and consider head nods or verbal greetings only. . Avoid touching your eyes, nose, or mouth with unwashed hands.  . Avoid close contact with people who are sick. . Avoid places or events with large numbers of people in one location, like concerts or sporting events. . Carefully consider travel plans you have or are making. . If you are planning any travel outside or inside the [place], visit the CDC's Travelers' Health webpage for the latest health notices. . If you have some symptoms but not all symptoms, continue to monitor at home and seek medical attention if your symptoms worsen. . If you are having a medical emergency, call 911.  HOME CARE . Only take medications as instructed by your medical team. . Drink plenty of fluids and get plenty of rest. . A steam or ultrasonic humidifier can help if you have congestion.   GET HELP RIGHT AWAY IF: . You develop worsening fever. . You become short of breath . You  cough up blood. . Your symptoms become more severe MAKE SURE YOU   Understand these instructions.  Will watch your condition.  Will get help right away if you are not doing well or get worse.  Your e-visit answers were reviewed by a board certified advanced clinical practitioner to complete your personal care plan.  Depending on the condition, your plan could have included both over the counter or prescription medications.  If there is a problem please reply once you have received a response from your provider. Your safety is important to [place].  If you have drug  allergies check your prescription carefully.    You can use MyChart to ask questions about today's visit, request a non-urgent call back, or ask for a work or school excuse for 24 hours related to this e-Visit. If it has been greater than 24 hours you will need to follow up with your provider, or enter a new e-Visit to address those concerns. You will get an e-mail in the next two days asking about your experience.  I hope that your e-visit has been valuable and will speed your recovery. Thank you for using e-visits.

## 2018-07-03 ENCOUNTER — Other Ambulatory Visit: Payer: Self-pay | Admitting: Internal Medicine

## 2018-10-20 ENCOUNTER — Other Ambulatory Visit (HOSPITAL_COMMUNITY): Payer: Self-pay | Admitting: General Practice

## 2018-10-20 DIAGNOSIS — Z1231 Encounter for screening mammogram for malignant neoplasm of breast: Secondary | ICD-10-CM

## 2018-11-04 ENCOUNTER — Ambulatory Visit (HOSPITAL_COMMUNITY): Admission: RE | Admit: 2018-11-04 | Payer: PRIVATE HEALTH INSURANCE | Source: Ambulatory Visit

## 2018-11-04 ENCOUNTER — Encounter (HOSPITAL_COMMUNITY): Payer: Self-pay

## 2019-01-25 ENCOUNTER — Other Ambulatory Visit (HOSPITAL_COMMUNITY): Payer: Self-pay | Admitting: General Practice

## 2019-01-25 DIAGNOSIS — N63 Unspecified lump in unspecified breast: Secondary | ICD-10-CM

## 2019-02-08 ENCOUNTER — Ambulatory Visit (HOSPITAL_COMMUNITY)
Admission: RE | Admit: 2019-02-08 | Discharge: 2019-02-08 | Disposition: A | Discharge status: Home / Self Care | Payer: PRIVATE HEALTH INSURANCE | Source: Ambulatory Visit | Attending: General Practice | Admitting: General Practice

## 2019-02-08 ENCOUNTER — Other Ambulatory Visit: Payer: Self-pay

## 2019-02-08 ENCOUNTER — Ambulatory Visit (HOSPITAL_COMMUNITY): Admission: RE | Admit: 2019-02-08 | Payer: PRIVATE HEALTH INSURANCE | Source: Ambulatory Visit

## 2019-02-08 DIAGNOSIS — N63 Unspecified lump in unspecified breast: Secondary | ICD-10-CM

## 2019-06-02 ENCOUNTER — Encounter (INDEPENDENT_AMBULATORY_CARE_PROVIDER_SITE_OTHER): Payer: Self-pay

## 2020-04-11 ENCOUNTER — Other Ambulatory Visit (HOSPITAL_COMMUNITY): Payer: Self-pay | Admitting: Family Medicine

## 2020-04-11 DIAGNOSIS — Z1231 Encounter for screening mammogram for malignant neoplasm of breast: Secondary | ICD-10-CM

## 2020-04-16 ENCOUNTER — Other Ambulatory Visit: Payer: Self-pay

## 2020-04-16 ENCOUNTER — Ambulatory Visit (HOSPITAL_COMMUNITY)
Admission: RE | Admit: 2020-04-16 | Discharge: 2020-04-16 | Disposition: A | Discharge status: Home / Self Care | Payer: Self-pay | Source: Ambulatory Visit | Attending: Family Medicine | Admitting: Family Medicine

## 2020-04-16 DIAGNOSIS — Z1231 Encounter for screening mammogram for malignant neoplasm of breast: Secondary | ICD-10-CM | POA: Insufficient documentation

## 2020-08-08 ENCOUNTER — Other Ambulatory Visit: Payer: Self-pay

## 2020-08-08 ENCOUNTER — Emergency Department (HOSPITAL_COMMUNITY)
Admission: EM | Admit: 2020-08-08 | Discharge: 2020-08-09 | Disposition: A | Discharge status: Home / Self Care | Payer: 59 | Attending: Emergency Medicine | Admitting: Emergency Medicine

## 2020-08-08 DIAGNOSIS — H1033 Unspecified acute conjunctivitis, bilateral: Secondary | ICD-10-CM | POA: Diagnosis not present

## 2020-08-08 DIAGNOSIS — J029 Acute pharyngitis, unspecified: Secondary | ICD-10-CM | POA: Insufficient documentation

## 2020-08-08 DIAGNOSIS — H9203 Otalgia, bilateral: Secondary | ICD-10-CM | POA: Insufficient documentation

## 2020-08-08 DIAGNOSIS — H5712 Ocular pain, left eye: Secondary | ICD-10-CM | POA: Diagnosis present

## 2020-08-08 NOTE — ED Triage Notes (Addendum)
Pt c/o sore throat, red eyes and left ear pain for passed 3 days.  Pt spoke to Tele-Doc over phone and was only prescribed cough syrup. Pt also took 600 mg Ibuprofen, with some relief. Took Ibuprofen around 2230 tonight.  Pt stated that she started coughing up mucus just before arriving at hospital.  Hx tonsillectomy.

## 2020-08-09 MED ORDER — AMOXICILLIN 500 MG PO CAPS: 500.0000 mg | ORAL_CAPSULE | Freq: Three times a day (TID) | ORAL | 0 refills | Status: AC

## 2020-08-09 MED ORDER — ERYTHROMYCIN 5 MG/GM OP OINT: 1.0000 "application " | TOPICAL_OINTMENT | Freq: Four times a day (QID) | OPHTHALMIC | 0 refills | Status: AC

## 2020-08-09 NOTE — Discharge Instructions (Addendum)
You were evaluated in the Emergency Department and after careful evaluation, we did not find any emergent condition requiring admission or further testing in the hospital.  Your exam/testing today was overall reassuring.  Your symptoms may be due to a virus but we are covering you for possible bacterial infections of the eyes and the ears.  Please take the medications as directed and follow-up with your regular doctor.  Please return to the Emergency Department if you experience any worsening of your condition.  Thank you for allowing [place] to be a part of your care.

## 2020-08-09 NOTE — ED Provider Notes (Signed)
AP-EMERGENCY DEPT [facility] Emergency Department Provider Note MRN:  [id number]  Arrival date & time: 08/09/20     Chief Complaint   Sore Throat, Conjunctivitis, and Ear Pain (Left ear)   History of Present Illness   Carolyn Huang is a 55 y.o. year-old female with no pertinent past medical presenting to the ED with chief complaint of sore throat.  Patient is endorsing sore throat, ear pain, eye redness and irritation for the past 2 or 3 days.  Eyes seem to be getting worse, more red, associated with discharge from both eyes.  Pain in bilateral ears, worse on the left.  Denies fever, endorses general malaise.  Symptoms constant, mild to moderate, no exacerbating or alleviating factors.  Has had an home COVID test that is negative.  Review of Systems  A complete 10 system review of systems was obtained and all systems are negative except as noted in the HPI and PMH.   Patient's Health History   No past medical history on file.  Past Surgical History:  Procedure Laterality Date  . HERNIA REPAIR    . ROTATOR CUFF REPAIR    . THYROID SURGERY    . TONSILLECTOMY      No family history on file.  Social History   Socioeconomic History  . Marital status: Single    Spouse name: Not on file  . Number of children: Not on file  . Years of education: Not on file  . Highest education level: Not on file  Occupational History  . Not on file  Tobacco Use  . Smoking status: Never Smoker  . Smokeless tobacco: Never Used  Substance and Sexual Activity  . Alcohol use: No  . Drug use: No  . Sexual activity: Never  Other Topics Concern  . Not on file  Social History Narrative  . Not on file   Social Determinants of Health   Financial Resource Strain: Not on file  Food Insecurity: Not on file  Transportation Needs: Not on file  Physical Activity: Not on file  Stress: Not on file  Social Connections: Not on file  Intimate Partner Violence: Not on file     Physical Exam    Vitals:   08/08/20 2341  BP: 137/71  Pulse: 69  Resp: 16  Temp: 99.1 F (37.3 C)  SpO2: 97%    CONSTITUTIONAL: Well-appearing, NAD NEURO:  Alert and oriented x 3, no focal deficits EYES:  eyes equal and reactive, bilateral conjunctival erythema with discharge ENT/NECK:  no LAD, no JVD, erythematous TMs bilaterally, normal oropharynx CARDIO: Regular rate, well-perfused, normal S1 and S2 PULM:  CTAB no wheezing or rhonchi GI/GU:  normal bowel sounds, non-distended, non-tender MSK/SPINE:  No gross deformities, no edema SKIN:  no rash, atraumatic PSYCH:  Appropriate speech and behavior  *Additional and/or pertinent findings included in MDM below  Diagnostic and Interventional Summary    EKG Interpretation  Date/Time:    Ventricular Rate:    PR Interval:    QRS Duration:   QT Interval:    QTC Calculation:   R Axis:     Text Interpretation:        Labs Reviewed - No data to display  No orders to display    Medications - No data to display   Procedures  /  Critical Care Procedures  ED Course and Medical Decision Making  I have reviewed the triage vital signs, the nursing notes, and pertinent available records from the EMR.  Listed above  are laboratory and imaging tests that I personally ordered, reviewed, and interpreted and then considered in my medical decision making (see below for details).  Suspect viral illness, but will cover for bacterial conjunctivitis given the discharge and significant redness.  There is no vision loss, symptoms seem to be due to an infectious cause, nothing to suggest glaucoma or other pathology.  TMs are also inflamed, will cover with amoxicillin for otitis media.  Appropriate for discharge.       Elmer Sow. Pilar Plate, MD [facility] Emergency Medicine [facility] [email or web address]  Final Clinical Impressions(s) / ED Diagnoses     ICD-10-CM   1. Acute conjunctivitis of both eyes, unspecified acute conjunctivitis  type  H10.33   2. Otalgia of both ears  H92.03     ED Discharge Orders         Ordered    erythromycin ophthalmic ointment  4 times daily        08/09/20 0048    amoxicillin (AMOXIL) 500 MG capsule  3 times daily        08/09/20 0048           Discharge Instructions Discussed with and Provided to Patient:     Discharge Instructions     You were evaluated in the Emergency Department and after careful evaluation, we did not find any emergent condition requiring admission or further testing in the hospital.  Your exam/testing today was overall reassuring.  Your symptoms may be due to a virus but we are covering you for possible bacterial infections of the eyes and the ears.  Please take the medications as directed and follow-up with your regular doctor.  Please return to the Emergency Department if you experience any worsening of your condition.  Thank you for allowing [place] to be a part of your care.       Sabas Sous, MD 08/09/20 [phone]

## 2022-01-23 DIAGNOSIS — E039 Hypothyroidism, unspecified: Secondary | ICD-10-CM | POA: Diagnosis not present

## 2022-01-23 DIAGNOSIS — D509 Iron deficiency anemia, unspecified: Secondary | ICD-10-CM | POA: Diagnosis not present

## 2022-01-23 DIAGNOSIS — Z6831 Body mass index (BMI) 31.0-31.9, adult: Secondary | ICD-10-CM | POA: Diagnosis not present

## 2022-01-23 DIAGNOSIS — E669 Obesity, unspecified: Secondary | ICD-10-CM | POA: Diagnosis not present

## 2022-07-04 ENCOUNTER — Inpatient Hospital Stay: Payer: PRIVATE HEALTH INSURANCE

## 2022-07-04 ENCOUNTER — Inpatient Hospital Stay: Payer: PRIVATE HEALTH INSURANCE | Attending: Hematology | Admitting: Hematology

## 2022-07-04 ENCOUNTER — Encounter: Payer: Self-pay | Admitting: Hematology

## 2022-07-04 VITALS — BP 132/65 | HR 65 | Temp 98.4°F | Resp 16 | Ht 63.0 in | Wt 197.2 lb

## 2022-07-04 DIAGNOSIS — R42 Dizziness and giddiness: Secondary | ICD-10-CM | POA: Diagnosis not present

## 2022-07-04 DIAGNOSIS — D72819 Decreased white blood cell count, unspecified: Secondary | ICD-10-CM | POA: Diagnosis not present

## 2022-07-04 LAB — CBC WITH DIFFERENTIAL/PLATELET
Abs Immature Granulocytes: 0 10*3/uL (ref 0.00–0.07)
Basophils Absolute: 0 10*3/uL (ref 0.0–0.1)
Basophils Relative: 1 %
Eosinophils Absolute: 0.1 10*3/uL (ref 0.0–0.5)
Eosinophils Relative: 3 %
HCT: 36.2 % (ref 36.0–46.0)
Hemoglobin: 12 g/dL (ref 12.0–15.0)
Immature Granulocytes: 0 %
Lymphocytes Relative: 25 %
Lymphs Abs: 0.8 10*3/uL (ref 0.7–4.0)
MCH: 31.3 pg (ref 26.0–34.0)
MCHC: 33.1 g/dL (ref 30.0–36.0)
MCV: 94.3 fL (ref 80.0–100.0)
Monocytes Absolute: 0.4 10*3/uL (ref 0.1–1.0)
Monocytes Relative: 12 %
Neutro Abs: 1.8 10*3/uL (ref 1.7–7.7)
Neutrophils Relative %: 59 %
Platelets: 244 10*3/uL (ref 150–400)
RBC: 3.84 MIL/uL — ABNORMAL LOW (ref 3.87–5.11)
RDW: 13.4 % (ref 11.5–15.5)
WBC: 3 10*3/uL — ABNORMAL LOW (ref 4.0–10.5)
nRBC: 0 % (ref 0.0–0.2)

## 2022-07-04 LAB — RETICULOCYTES
Immature Retic Fract: 10.8 % (ref 2.3–15.9)
RBC.: 3.83 MIL/uL — ABNORMAL LOW (ref 3.87–5.11)
Retic Count, Absolute: 90.4 10*3/uL (ref 19.0–186.0)
Retic Ct Pct: 2.4 % (ref 0.4–3.1)

## 2022-07-04 LAB — FOLATE: Folate: 8.6 ng/mL (ref >5.9)

## 2022-07-04 LAB — LACTATE DEHYDROGENASE: LDH: 140 U/L (ref 98–192)

## 2022-07-04 LAB — VITAMIN B12: Vitamin B-12: 330 pg/mL (ref 180–914)

## 2022-07-04 NOTE — Patient Instructions (Signed)
[facility]  Discharge Instructions  You were seen and examined today by Dr. Ellin Saba. Dr. Ellin Saba is a hematologist, meaning that he specializes in blood abnormalities. Dr. Ellin Saba discussed your past medical history, family history of cancers/blood conditions and the events that led to you being here today.  You were referred to Dr. Ellin Saba due to low white blood cell count. White blood cells are the blood cell responsible for fighting infection.  Dr. Ellin Saba has recommended additional labs today for further evaluation.  Follow-up as scheduled.  Thank you for choosing [facility] to provide your oncology and hematology care.   To afford each patient quality time with our provider, please arrive at least 15 minutes before your scheduled appointment time. You may need to reschedule your appointment if you arrive late (10 or more minutes). Arriving late affects you and other patients whose appointments are after yours.  Also, if you miss three or more appointments without notifying the office, you may be dismissed from the clinic at the provider's discretion.    Again, thank you for choosing [facility].  Our hope is that these requests will decrease the amount of time that you wait before being seen by our physicians.   If you have a lab appointment with the Cancer Center - please note that after April 8th, all labs will be drawn in the cancer center.  You do not have to check in or register with the main entrance as you have in the past but will complete your check-in at the cancer center.            _____________________________________________________________  Should you have questions after your visit to [facility], please contact our office at [phone] and follow the prompts.  Our office hours are 8:00 a.m. to 4:30 p.m. Monday - Thursday and 8:00 a.m. to 2:30 p.m. Friday.  Please note that  voicemails left after 4:00 p.m. may not be returned until the following business day.  We are closed weekends and all major holidays.  You do have access to a nurse 24-7, just call the main number to the clinic [phone] and do not press any options, hold on the line and a nurse will answer the phone.    For prescription refill requests, have your pharmacy contact our office and allow 72 hours.    Masks are no longer required in the cancer centers. If you would like for your care team to wear a mask while they are taking care of you, please let them know. You may have one support person who is at least 56 years old accompany you for your appointments.

## 2022-07-04 NOTE — Progress Notes (Signed)
CONSULT NOTE  Patient Care Team: Dekoninck, Risa Grill, NP as PCP - General (General Practice) Doreatha Massed, MD as Medical Oncologist (Hematology)  CHIEF COMPLAINTS/PURPOSE OF CONSULTATION:  Mild leukopenia  HISTORY OF PRESENTING ILLNESS:  Carolyn Huang 57 y.o. female is seen in consultation today at the request of Colvin Caroli, NP for mild leukopenia.  CBC from 06/03/2022 showed white count of 3.1 with normal ANC.  Differential was grossly normal.  Platelet count and hemoglobin was normal.  She was previously seen at hematology clinic in [facility] for iron deficiency anemia and received Feraheme x 2 in October 2019.  She is currently taking iron tablet 3 times a week.  She reports dizzy episodes once every 2 to 3 months, sounds like panic attacks.  No prior history of blood transfusion.  No recurrent infections.  She had EGD and colonoscopy done in 2019.  No personal or family history of connective tissue disorders.   MEDICAL HISTORY:  History reviewed. No pertinent past medical history.  SURGICAL HISTORY: Past Surgical History:  Procedure Laterality Date   HERNIA REPAIR     ROTATOR CUFF REPAIR     THYROID SURGERY     TONSILLECTOMY      SOCIAL HISTORY: Social History   Socioeconomic History   Marital status: Single    Spouse name: Not on file   Number of children: Not on file   Years of education: Not on file   Highest education level: Not on file  Occupational History   Not on file  Tobacco Use   Smoking status: Never   Smokeless tobacco: Never  Substance and Sexual Activity   Alcohol use: No   Drug use: No   Sexual activity: Never  Other Topics Concern   Not on file  Social History Narrative   Not on file   Social Determinants of Health   Financial Resource Strain: Not on file  Food Insecurity: Food Insecurity Present (07/04/2022)   Hunger Vital Sign    Worried About Running Out of Food in the Last Year: Never true    Ran Out of Food in the Last Year:  Sometimes true  Transportation Needs: No Transportation Needs (07/04/2022)   PRAPARE - Administrator, Civil Service (Medical): No    Lack of Transportation (Non-Medical): No  Physical Activity: Not on file  Stress: Not on file  Social Connections: Not on file  Intimate Partner Violence: Not At Risk (07/04/2022)   Humiliation, Afraid, Rape, and Kick questionnaire    Fear of Current or Ex-Partner: No    Emotionally Abused: No    Physically Abused: No    Sexually Abused: No    FAMILY HISTORY: History reviewed. No pertinent family history.  ALLERGIES:  has No Known Allergies.  MEDICATIONS:  Current Outpatient Medications  Medication Sig Dispense Refill   ferrous sulfate 324 (65 Fe) MG TBEC Take 1 tablet by mouth daily.     ibuprofen (ADVIL,MOTRIN) 400 MG tablet Take 1 tablet (400 mg total) by mouth every 6 (six) hours as needed. 30 tablet 0   levothyroxine (SYNTHROID, LEVOTHROID) 200 MCG tablet Take 200 mcg by mouth daily.  2   norethindrone (MICRONOR) 0.35 MG tablet Take 1 tablet by mouth daily.     promethazine-dextromethorphan (PROMETHAZINE-DM) 6.25-15 MG/5ML syrup Take 5 mLs by mouth 4 (four) times daily as needed for cough. 118 mL 0   Vitamin D, Ergocalciferol, (DRISDOL) 1.25 MG (50000 UNIT) CAPS capsule Take 50,000 Units by mouth once a  week.     No current facility-administered medications for this visit.    REVIEW OF SYSTEMS:   Constitutional: Denies fevers, chills or abnormal night sweats.  Positive for hot flashes. Eyes: Denies blurriness of vision, double vision or watery eyes Ears, nose, mouth, throat, and face: Denies mucositis or sore throat Respiratory: Denies cough, dyspnea or wheezes Cardiovascular: Denies palpitation, chest discomfort or lower extremity swelling Gastrointestinal:  Denies nausea, heartburn or change in bowel habits Skin: Denies abnormal skin rashes Lymphatics: Denies new lymphadenopathy or easy bruising Neurological:Denies numbness,  tingling or new weaknesses.  Positive for occasional dizziness. Behavioral/Psych: Mood is stable, no new changes  All other systems were reviewed with the patient and are negative.  PHYSICAL EXAMINATION: ECOG PERFORMANCE STATUS: 0 - Asymptomatic  Vitals:   07/04/22 1241  BP: 132/65  Pulse: 65  Resp: 16  Temp: 98.4 F (36.9 C)  SpO2: 100%   Filed Weights   07/04/22 1241  Weight: 197 lb 3.2 oz (89.4 kg)    GENERAL:alert, no distress and comfortable SKIN: skin color, texture, turgor are normal, no rashes or significant lesions EYES: normal, conjunctiva are pink and non-injected, sclera clear OROPHARYNX:no exudate, no erythema and lips, buccal mucosa, and tongue normal  NECK: supple, thyroid normal size, non-tender, without nodularity LYMPH:  no palpable lymphadenopathy in the cervical, axillary or inguinal LUNGS: clear to auscultation and percussion with normal breathing effort HEART: regular rate & rhythm and no murmurs and no lower extremity edema ABDOMEN:abdomen soft, non-tender and normal bowel sounds Musculoskeletal:no cyanosis of digits and no clubbing  PSYCH: alert & oriented x 3 with fluent speech NEURO: no focal motor/sensory deficits  LABORATORY DATA:  I have reviewed the data as listed Recent Results (from the past 2160 hour(s))  Lactate dehydrogenase     Status: None   Collection Time: 07/04/22  1:02 PM  Result Value Ref Range   LDH 140 98 - 192 U/L    Comment: Performed at [facility], [address] [place], [place] [address]  Reticulocytes     Status: Abnormal   Collection Time: 07/04/22  1:02 PM  Result Value Ref Range   Retic Ct Pct 2.4 0.4 - 3.1 %   RBC. 3.83 (L) 3.87 - 5.11 MIL/uL   Retic Count, Absolute 90.4 19.0 - 186.0 K/uL   Immature Retic Fract 10.8 2.3 - 15.9 %    Comment: Performed at [facility], [address], [place], [place] [address]  CBC with Differential     Status: Abnormal   Collection Time: 07/04/22  1:02 PM  Result Value Ref  Range   WBC 3.0 (L) 4.0 - 10.5 K/uL   RBC 3.84 (L) 3.87 - 5.11 MIL/uL   Hemoglobin 12.0 12.0 - 15.0 g/dL   HCT 13.0 86.5 - 78.4 %   MCV 94.3 80.0 - 100.0 fL   MCH 31.3 26.0 - 34.0 pg   MCHC 33.1 30.0 - 36.0 g/dL   RDW 69.6 29.5 - 28.4 %   Platelets 244 150 - 400 K/uL   nRBC 0.0 0.0 - 0.2 %   Neutrophils Relative % 59 %   Neutro Abs 1.8 1.7 - 7.7 K/uL   Lymphocytes Relative 25 %   Lymphs Abs 0.8 0.7 - 4.0 K/uL   Monocytes Relative 12 %   Monocytes Absolute 0.4 0.1 - 1.0 K/uL   Eosinophils Relative 3 %   Eosinophils Absolute 0.1 0.0 - 0.5 K/uL   Basophils Relative 1 %   Basophils Absolute 0.0 0.0 -  0.1 K/uL   Immature Granulocytes 0 %   Abs Immature Granulocytes 0.00 0.00 - 0.07 K/uL    Comment: Performed at [facility], [address] [place], [place] [address]    RADIOGRAPHIC STUDIES: I have personally reviewed the radiological images as listed and agreed with the findings in the report. No results found.  ASSESSMENT:  1.  Leukopenia: - Patient seen at the request of Colvin Caroli, NP - CBC (06/03/2022): WBC-3.1, Hb-11.9, PLT-277, 51% N, 35% L, 7% M, 4% E - WBC normal in 2019, slightly low in October 2017. - CT AP (12/07/2017): Spleen normal size.  2.  Social/family history: - Seen today with her youngest daughter.  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  Non-smoker nonalcoholic. - Father had prostate cancer.  Maternal cousin had breast cancer.  Maternal aunt had lung and breast cancers.  PLAN:  1.  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  She does not have any recurrent infections.  No B symptoms. - Differential diagnosis includes benign ethnic neutropenia. - Will check CBCD today along with LDH and reticulocyte count.  Will also check for nutritional deficiencies, connective tissue disorders.  Will check SPEP. - RTC 3 weeks for follow-up.   All questions were answered. The  patient knows to call the clinic with any problems, questions or concerns.      Doreatha Massed, MD 07/04/22 1:55 PM

## 2022-07-06 LAB — RHEUMATOID FACTOR: Rheumatoid fact SerPl-aCnc: <10 IU/mL (ref <14.0)

## 2022-07-08 LAB — PROTEIN ELECTROPHORESIS, SERUM
A/G Ratio: 1.1 (ref 0.7–1.7)
Albumin ELP: 3.5 g/dL (ref 2.9–4.4)
Alpha-1-Globulin: 0.2 g/dL (ref 0.0–0.4)
Alpha-2-Globulin: 0.7 g/dL (ref 0.4–1.0)
Beta Globulin: 1 g/dL (ref 0.7–1.3)
Gamma Globulin: 1.3 g/dL (ref 0.4–1.8)
Globulin, Total: 3.1 g/dL (ref 2.2–3.9)
Total Protein ELP: 6.6 g/dL (ref 6.0–8.5)

## 2022-07-08 LAB — METHYLMALONIC ACID, SERUM: Methylmalonic Acid, Quantitative: 370 nmol/L (ref 0–378)

## 2022-07-08 LAB — COPPER, SERUM: Copper: 120 ug/dL (ref 80–158)

## 2022-07-08 LAB — FANA STAINING PATTERNS: Speckled Pattern: 24529

## 2022-07-08 LAB — ANTINUCLEAR ANTIBODIES, IFA: ANA Ab, IFA: POSITIVE — AB

## 2022-07-17 ENCOUNTER — Inpatient Hospital Stay: Payer: PRIVATE HEALTH INSURANCE | Admitting: Licensed Clinical Social Worker

## 2022-07-17 DIAGNOSIS — D72819 Decreased white blood cell count, unspecified: Secondary | ICD-10-CM

## 2022-07-22 NOTE — Progress Notes (Signed)
[facility] Clinical Social Work  Initial Assessment   Carolyn Huang is a 57 y.o. year old female contacted by phone. Clinical Social Work was referred by medical provider for assessment of psychosocial needs.   SDOH (Social Determinants of Health) assessments performed: Yes SDOH Interventions    Flowsheet Row Office Visit from 07/04/2022 in [facility]  SDOH Interventions   Food Insecurity Interventions AMB Referral  Housing Interventions Intervention Not Indicated  Transportation Interventions Intervention Not Indicated  Utilities Interventions Intervention Not Indicated       SDOH Screenings   Food Insecurity: Food Insecurity Present (07/04/2022)  Housing: Low Risk  (07/04/2022)  Transportation Needs: No Transportation Needs (07/04/2022)  Utilities: Not At Risk (07/04/2022)  Depression (PHQ2-9): Low Risk  (07/04/2022)  Tobacco Use: Low Risk  (07/04/2022)     Distress Screen completed: No     No data to display            Family/Social Information:  Housing Arrangement: patient lives alone Family members/support persons in your life? Pt reports she has family residing nearby, but does not have family in a position to offer significant support. 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  Income source: Employment Financial concerns: Yes, current concerns Type of concern: Utilities, Rent/ mortgage, and Medical bills Food access concerns: yes, pt is connected to a food pantry where she picks up food once a month Religious or spiritual practice: Not known Services Currently in place:  none  Coping/ Adjustment to diagnosis: Patient understands treatment plan and what happens next? Pt seen due to leukopenia, pt will continue to undergo monitoring Concerns about diagnosis and/or treatment: How I will pay for the services I need Patient reported stressors: Finances Hopes and/or  priorities: pt's priority is to continue to follow up with medical appointments with the hope of medical issues identified resolving Patient enjoys  not addressed Current coping skills/ strengths: Capable of independent living , Motivation for treatment/growth , and Physical Health     SUMMARY: Current SDOH Barriers:  Financial constraints related to limited income  Clinical Social Work Clinical Goal(s):  Explore community resources related to financial support  Interventions: Discussed common feeling and emotions when being diagnosed with cancer, and the importance of support during treatment Informed patient of the support team roles and support services at [facility] Provided CSW contact information and encouraged patient to call with any questions or concerns Pt presently has Veterinary surgeon.  Encouraged pt to reapply for Medicaid and explore eligibility for supportive programs available through the Department of Social Services.  Pt already connected to a food pantry and will explore additional pantry options.    Follow Up Plan: Patient will contact CSW with any support or resource needs Patient verbalizes understanding of plan: Yes    Rachel Moulds, LCSW Clinical Social Worker [facility]

## 2022-07-24 ENCOUNTER — Inpatient Hospital Stay: Payer: PRIVATE HEALTH INSURANCE | Admitting: Physician Assistant

## 2022-07-24 ENCOUNTER — Encounter: Payer: Self-pay | Admitting: Physician Assistant

## 2022-07-24 VITALS — BP 146/67 | HR 63 | Temp 99.0°F | Resp 16 | Wt 198.0 lb

## 2022-07-24 DIAGNOSIS — R768 Other specified abnormal immunological findings in serum: Secondary | ICD-10-CM | POA: Diagnosis not present

## 2022-07-24 DIAGNOSIS — D72819 Decreased white blood cell count, unspecified: Secondary | ICD-10-CM

## 2022-07-24 NOTE — Progress Notes (Signed)
PROGRESS NOTE  Patient Care Team: Carolyn Huang, Carolyn Grill, NP as PCP - General (General Practice) Carolyn Massed, MD as Medical Oncologist (Hematology)  CHIEF COMPLAINTS:  Leukopenia  HISTORY OF PRESENTING ILLNESS:  Carolyn Huang 57 y.o. female returns for a follow up for leukopenia. She was last seen by Dr. Ellin Huang on 07/04/2022. She is unaccompanied for this visit.   Carolyn Huang reports she is feeling well without any changes to her health. She reports her energy and appetite are overall stable. She is able to complete her ADLs on her own. She denies any fevers, recurrent infections, chills, sweats, shortness of breath, chest pain or cough. She has no other complaints. Rest of the ROS is below.   MEDICAL HISTORY:  History reviewed. No pertinent past medical history.  SURGICAL HISTORY: Past Surgical History:  Procedure Laterality Date   HERNIA REPAIR     ROTATOR CUFF REPAIR     THYROID SURGERY     TONSILLECTOMY      SOCIAL HISTORY: Social History   Socioeconomic History   Marital status: Single    Spouse name: Not on file   Number of children: Not on file   Years of education: Not on file   Highest education level: Not on file  Occupational History   Not on file  Tobacco Use   Smoking status: Never   Smokeless tobacco: Never  Substance and Sexual Activity   Alcohol use: No   Drug use: No   Sexual activity: Never  Other Topics Concern   Not on file  Social History Narrative   Not on file   Social Determinants of Health   Financial Resource Strain: High Risk (07/22/2022)   Overall Financial Resource Strain (CARDIA)    Difficulty of Paying Living Expenses: Very hard  Food Insecurity: Food Insecurity Present (07/04/2022)   Hunger Vital Sign    Worried About Running Out of Food in the Last Year: Never true    Ran Out of Food in the Last Year: Sometimes true  Transportation Needs: No Transportation Needs (07/04/2022)   PRAPARE - Scientist, research (physical sciences) (Medical): No    Lack of Transportation (Non-Medical): No  Physical Activity: Not on file  Stress: Not on file  Social Connections: Not on file  Intimate Partner Violence: Not At Risk (07/04/2022)   Humiliation, Afraid, Rape, and Kick questionnaire    Fear of Current or Ex-Partner: No    Emotionally Abused: No    Physically Abused: No    Sexually Abused: No    FAMILY HISTORY: History reviewed. No pertinent family history.  ALLERGIES:  has No Known Allergies.  MEDICATIONS:  Current Outpatient Medications  Medication Sig Dispense Refill   ferrous sulfate 324 (65 Fe) MG TBEC Take 1 tablet by mouth daily.     ibuprofen (ADVIL,MOTRIN) 400 MG tablet Take 1 tablet (400 mg total) by mouth every 6 (six) hours as needed. 30 tablet 0   levothyroxine (SYNTHROID, LEVOTHROID) 200 MCG tablet Take 200 mcg by mouth daily.  2   norethindrone (MICRONOR) 0.35 MG tablet Take 1 tablet by mouth daily.     promethazine-dextromethorphan (PROMETHAZINE-DM) 6.25-15 MG/5ML syrup Take 5 mLs by mouth 4 (four) times daily as needed for cough. 118 mL 0   Vitamin D, Ergocalciferol, (DRISDOL) 1.25 MG (50000 UNIT) CAPS capsule Take 50,000 Units by mouth once a week.     No current facility-administered medications for this visit.    REVIEW OF SYSTEMS:   Constitutional:  Denies fevers, chills or abnormal night sweats.  Positive for hot flashes. Eyes: Denies blurriness of vision, double vision or watery eyes Ears, nose, mouth, throat, and face: Denies mucositis or sore throat Respiratory: Denies cough, dyspnea or wheezes Cardiovascular: Denies palpitation, chest discomfort or lower extremity swelling Gastrointestinal:  Denies nausea, heartburn or change in bowel habits Skin: Denies abnormal skin rashes Lymphatics: Denies new lymphadenopathy or easy bruising Neurological:Denies numbness, tingling or new weaknesses.   Behavioral/Psych: Mood is stable, no new changes  All other systems were reviewed  with the patient and are negative.  PHYSICAL EXAMINATION: ECOG PERFORMANCE STATUS: 0 - Asymptomatic  Vitals:   07/24/22 1430  BP: (!) 146/67  Pulse: 63  Resp: 16  Temp: 99 F (37.2 C)  SpO2: 100%   Filed Weights   07/24/22 1430  Weight: 198 lb (89.8 kg)    GENERAL:alert, no distress and comfortable SKIN: skin color, texture, turgor are normal, no rashes or significant lesions EYES: normal, conjunctiva are pink and non-injected, sclera clear OROPHARYNX:no exudate, no erythema and lips, buccal mucosa, and tongue normal  LYMPH:  no palpable lymphadenopathy in the cervical or supraclavicular region LUNGS: clear to auscultation and percussion with normal breathing effort HEART: regular rate & rhythm and no murmurs and no lower extremity edema Musculoskeletal:no cyanosis of digits and no clubbing  PSYCH: alert & oriented x 3 with fluent speech NEURO: no focal motor/sensory deficits  LABORATORY DATA:  I have reviewed the data as listed Recent Results (from the past 2160 hour(s))  Rheumatoid factor     Status: None   Collection Time: 07/04/22  1:02 PM  Result Value Ref Range   Rheumatoid fact SerPl-aCnc <10.0 <14.0 IU/mL    Comment: (NOTE) Performed At: [facility] [address] [place], [place] [id number] Jolene Schimke MD [id number]   ANA, IFA (with reflex)     Status: Abnormal   Collection Time: 07/04/22  1:02 PM  Result Value Ref Range   ANA Ab, IFA Positive (A)     Comment: (NOTE)                                     Negative   <1:80                                     Borderline  1:80                                     Positive   >1:80 Performed At: [facility] [place] [address] [place], [place] [id number] Jolene Schimke MD [id number]   Copper, serum     Status: None   Collection Time: 07/04/22  1:02 PM  Result Value Ref Range   Copper 120 80 - 158 ug/dL    Comment: (NOTE) This test was developed and its performance  characteristics determined by [facility]. It has not been cleared or approved by the Food and Drug Administration.                                Detection Limit = 5 Performed At: [facility] [address] [place], [place] [id number] Jolene Schimke MD [id number]   Protein electrophoresis, serum  Status: None   Collection Time: 07/04/22  1:02 PM  Result Value Ref Range   Total Protein ELP 6.6 6.0 - 8.5 g/dL   Albumin ELP 3.5 2.9 - 4.4 g/dL   WUJWJ-1-BJYNWGNF 0.2 0.0 - 0.4 g/dL   AOZHY-8-MVHQIONG 0.7 0.4 - 1.0 g/dL   Beta Globulin 1.0 0.7 - 1.3 g/dL   Gamma Globulin 1.3 0.4 - 1.8 g/dL   M-Spike, % Not Observed Not Observed g/dL   SPE Interp. Comment     Comment: (NOTE) The SPE pattern appears unremarkable. Evidence of monoclonal protein is not apparent. Performed At: [facility] [address] [place], [place] [id number] Jolene Schimke MD [id number]    Comment Comment     Comment: (NOTE) Protein electrophoresis scan will follow via computer, mail, or courier delivery.    Globulin, Total 3.1 2.2 - 3.9 g/dL   A/G Ratio 1.1 0.7 - 1.7  Methylmalonic acid, serum     Status: None   Collection Time: 07/04/22  1:02 PM  Result Value Ref Range   Methylmalonic Acid, Quantitative 370 0 - 378 nmol/L    Comment: (NOTE) This test was developed and its performance characteristics determined by [facility]. It has not been cleared or approved by the Food and Drug Administration. Performed At: [facility] [address] [place], [place] [id number] Jolene Schimke MD [id number]:[id number]   Folate     Status: None   Collection Time: 07/04/22  1:02 PM  Result Value Ref Range   Folate 8.6 >5.9 ng/mL    Comment: Performed at [facility], [address], [place], [place] [address]  Vitamin B12     Status: None   Collection Time: 07/04/22  1:02 PM  Result Value Ref Range   Vitamin B-12 330 180 - 914 pg/mL    Comment: (NOTE) This assay is not validated for testing  neonatal or myeloproliferative syndrome specimens for Vitamin B12 levels. Performed at [facility], [address] [place], [place] [address]   Lactate dehydrogenase     Status: None   Collection Time: 07/04/22  1:02 PM  Result Value Ref Range   LDH 140 98 - 192 U/L    Comment: Performed at [facility], [address], [place], [place] [address]  Reticulocytes     Status: Abnormal   Collection Time: 07/04/22  1:02 PM  Result Value Ref Range   Retic Ct Pct 2.4 0.4 - 3.1 %   RBC. 3.83 (L) 3.87 - 5.11 MIL/uL   Retic Count, Absolute 90.4 19.0 - 186.0 K/uL   Immature Retic Fract 10.8 2.3 - 15.9 %    Comment: Performed at [facility], [address] [place], [place] [address]  CBC with Differential     Status: Abnormal   Collection Time: 07/04/22  1:02 PM  Result Value Ref Range   WBC 3.0 (L) 4.0 - 10.5 K/uL   RBC 3.84 (L) 3.87 - 5.11 MIL/uL   Hemoglobin 12.0 12.0 - 15.0 g/dL   HCT 23.5 57.3 - 22.0 %   MCV 94.3 80.0 - 100.0 fL   MCH 31.3 26.0 - 34.0 pg   MCHC 33.1 30.0 - 36.0 g/dL   RDW 25.4 27.0 - 62.3 %   Platelets 244 150 - 400 K/uL   nRBC 0.0 0.0 - 0.2 %   Neutrophils Relative % 59 %   Neutro Abs 1.8 1.7 - 7.7 K/uL   Lymphocytes Relative 25 %   Lymphs Abs 0.8 0.7 - 4.0 K/uL   Monocytes Relative  12 %   Monocytes Absolute 0.4 0.1 - 1.0 K/uL   Eosinophils Relative 3 %   Eosinophils Absolute 0.1 0.0 - 0.5 K/uL   Basophils Relative 1 %   Basophils Absolute 0.0 0.0 - 0.1 K/uL   Immature Granulocytes 0 %   Abs Immature Granulocytes 0.00 0.00 - 0.07 K/uL    Comment: Performed at [facility], [address] [place], [place] [address]  FANA Staining Patterns     Status: None   Collection Time: 07/04/22  1:02 PM  Result Value Ref Range   Speckled Pattern 2,4,5,29     Comment: 1:80 ICAP nomenclature: AC    Note: Comment     Comment: (NOTE) Pattern              Potential Disease Association -------------  --------------------------------------------- Homogeneous    Systemic  Lupus Erythematosus, Drug Induced               Systemic Lupus Erythematosus, Chronic               Autoimmune hepatitis, Juvenile Idiopathic               Arthritis -------------  --------------------------------------------- Speckled       Sjogren Syndrome, Systemic Lupus               Erythematosus, Subacute Cutaneous Lupus,               Neonatal Lupus, Congenital Heart Block,               Mixed Connective Tissue Disease,               Scleroderma-diffuse, Scleroderma-Autoimmune               Myositis Overlap Syndrome, Systemic Lupus               Erythematosus-Scleroderma-Autoimmune               Myositis Overlap Syndrome, Systemic               Autoimmune Rheumatic Disease,               Undifferentiated Connective Tissue Disease -------------  --------------------------------------------- Nucleolar      Systemic Sclero sis, Scleroderma-Autoimmune               Myositis Overlap Syndrome, Sjogren               Syndrome, Raynaud phenomenon, Pulmonary               Arterial Hypertension, Systemic Autoimmune               Rheumatic Disease, Cancer -------------  --------------------------------------------- Centromere     Scleroderma-CREST, Limited Cutaneous SSc,               Raynaud's Phenomenon, Primary Biliary               Cholangitis -------------  --------------------------------------------- Nuclear Dot    Primary Biliary Cholangitis -------------  --------------------------------------------- Nuclear        Primary Biliary Cholangitis, Autoimmune Membrane       Hepatitis/Liver disease, Systemic Autoimmune               Rheumatic Disease, Autoimmune Cytopenias,               Linear Scleroderma, Antiphospholipid Syndrome -------------  --------------------------------------------- Performed At: [facility] [address] [place], [place] [id number] Jolene Schimke MD Ph:[phone]     RADIOGRAPHIC STUDIES:  I have personally reviewed the radiological images  as listed and agreed with the findings in the report. No results found.  ASSESSMENT:  1.  Leukopenia: - Patient seen at the request of Colvin Caroli, NP - CBC (06/03/2022): WBC-3.1, Hb-11.9, PLT-277, 51% N, 35% L, 7% M, 4% E - WBC normal in 2019, slightly low in October 2017. - CT AP (12/07/2017): Spleen normal size.  2.  Social/family history: - Seen today with her youngest daughter.  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  Non-smoker nonalcoholic. - Father had prostate cancer.  Maternal cousin had breast cancer.  Maternal aunt had lung and breast cancers.  PLAN:  1.  Mild leukopenia: - Labs from 07/04/2022 were reviewed. CBC showed stable leukopenia with WBC 3.0. Differential was normal. No evidence of nutritional deficiencies or paraproteinemia. ANA ab was positive.  --Recommend to monitor for now and send referral to rheumatology for evaluation for underlying inflammatory process.  - RTC 6 months with labs and follow-up.   All questions were answered. The patient knows to call the clinic with any problems, questions or concerns.  I have spent a total of 25 minutes minutes of face-to-face and non-face-to-face time, preparing to see the patient,  performing a medically appropriate examination, counseling and educating the patient, referring and communicating with other health care professionals, documenting clinical information in the electronic health record, and care coordination.   Georga Kaufmann PA-C Dept of Hematology and Oncology [facility]

## 2022-12-30 ENCOUNTER — Other Ambulatory Visit (HOSPITAL_COMMUNITY): Payer: Self-pay | Admitting: Family Medicine

## 2022-12-30 DIAGNOSIS — Z1231 Encounter for screening mammogram for malignant neoplasm of breast: Secondary | ICD-10-CM

## 2023-01-05 ENCOUNTER — Ambulatory Visit (HOSPITAL_COMMUNITY)
Admission: RE | Admit: 2023-01-05 | Discharge: 2023-01-05 | Disposition: A | Discharge status: Home / Self Care | Payer: No Typology Code available for payment source | Source: Ambulatory Visit | Attending: Family Medicine | Admitting: Family Medicine

## 2023-01-05 DIAGNOSIS — Z1231 Encounter for screening mammogram for malignant neoplasm of breast: Secondary | ICD-10-CM | POA: Insufficient documentation

## 2023-01-27 ENCOUNTER — Other Ambulatory Visit: Payer: Self-pay

## 2023-01-27 DIAGNOSIS — D72819 Decreased white blood cell count, unspecified: Secondary | ICD-10-CM

## 2023-01-27 NOTE — Progress Notes (Signed)
Lab orders entered

## 2023-01-28 ENCOUNTER — Inpatient Hospital Stay: Payer: No Typology Code available for payment source | Attending: Hematology

## 2023-01-28 DIAGNOSIS — E538 Deficiency of other specified B group vitamins: Secondary | ICD-10-CM | POA: Diagnosis not present

## 2023-01-28 DIAGNOSIS — D72819 Decreased white blood cell count, unspecified: Secondary | ICD-10-CM | POA: Diagnosis present

## 2023-01-28 DIAGNOSIS — R768 Other specified abnormal immunological findings in serum: Secondary | ICD-10-CM | POA: Insufficient documentation

## 2023-01-28 LAB — CBC WITH DIFFERENTIAL/PLATELET
Abs Immature Granulocytes: 0 10*3/uL (ref 0.00–0.07)
Basophils Absolute: 0 10*3/uL (ref 0.0–0.1)
Basophils Relative: 1 %
Eosinophils Absolute: 0.1 10*3/uL (ref 0.0–0.5)
Eosinophils Relative: 2 %
HCT: 38.9 % (ref 36.0–46.0)
Hemoglobin: 12.6 g/dL (ref 12.0–15.0)
Immature Granulocytes: 0 %
Lymphocytes Relative: 35 %
Lymphs Abs: 0.9 10*3/uL (ref 0.7–4.0)
MCH: 30.6 pg (ref 26.0–34.0)
MCHC: 32.4 g/dL (ref 30.0–36.0)
MCV: 94.4 fL (ref 80.0–100.0)
Monocytes Absolute: 0.2 10*3/uL (ref 0.1–1.0)
Monocytes Relative: 8 %
Neutro Abs: 1.4 10*3/uL — ABNORMAL LOW (ref 1.7–7.7)
Neutrophils Relative %: 54 %
Platelets: 224 10*3/uL (ref 150–400)
RBC: 4.12 MIL/uL (ref 3.87–5.11)
RDW: 13.9 % (ref 11.5–15.5)
WBC: 2.6 10*3/uL — ABNORMAL LOW (ref 4.0–10.5)
nRBC: 0 % (ref 0.0–0.2)

## 2023-01-28 LAB — VITAMIN B12: Vitamin B-12: 355 pg/mL (ref 180–914)

## 2023-02-01 LAB — METHYLMALONIC ACID, SERUM: Methylmalonic Acid, Quantitative: 433 nmol/L — ABNORMAL HIGH (ref 0–378)

## 2023-02-03 NOTE — Progress Notes (Unsigned)
[facility] [address], [place] [address]   CLINIC:  Medical Oncology/Hematology  PCP:  Lucius Conn, NP No address on file None   REASON FOR VISIT:  Follow-up for leukopenia  PRIOR THERAPY: None  CURRENT THERAPY: Surveillance  INTERVAL HISTORY:   [name] 57 y.o. female returns for routine follow-up of leukopenia.  She was last seen by Georga Kaufmann, PA-C on 07/24/2022.  At today's visit, she reports feeling fairly well.  No recent hospitalizations, surgeries, or changes in baseline health status.  She denies any frequent infections, B symptoms, masses, or lymphadenopathy.  She does report some mild joint stiffness in the mornings that lasts less than 15 minutes.  She denies any abnormal arthralgias or rashes.  She has 70% energy and 60% appetite. She endorses that she is maintaining a stable weight.   ASSESSMENT & PLAN:  1.  Leukopenia: - Patient seen at the request of Colvin Caroli, NP - Patient shows low or low-normal WBCs on limited prior lab data - WBC 3.6 (12/28/2015), WBC 5.5 (12/06/2017), WBC 3.0 (07/04/2022), WBC 2.6 (01/28/2023).  Differential has been normal apart from isolated neutropenia (ANC 1.4) on 01/28/2023. - No anemia or thrombocytopenia - CT AP (12/07/2017): Spleen normal size. - Hematology workup (07/04/2022): ANA positive.  Rheumatoid factor negative. Normal copper, folate, B12 SPEP unremarkable.  Normal LDH and reticulocytes. - She was referred to rheumatology (May 2024) due to ANA positivity. - Most recent labs (01/28/2023): WBC 2.6/ANC 1.4.  B12 355, with elevated MMA 433. -  denies any recurrent infections, B symptoms, masses, or lymphadenopathy - DIFFERENTIAL DIAGNOSIS favors benign ethnic neutropenia, but also includes leukopenia related to mild B12 deficiency or underlying pro inflammatory state (ANA positive). - PLAN: No concern for malignant leukopenia at this time.  Recommend CBC/D every 3 months with office visit in 1  year. -- Recommend vitamin B12 500 mcg every other day - If long-term clinical stability demonstrated, would consider discharge from clinic  2.  History of iron deficiency anemia - Patient reports that she has history of low iron related to menorrhagia and previously received iron infusions in [place] - She is currently "on a pill" that makes her periods lighter - She denies any rectal bleeding or melena, but does have some fatigue - PLAN: Recommended that she restart iron tablet every other day.  We will check iron panel at follow up.    3.  Social/family history: - Seen today with her youngest daughter.  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  Non-smoker.  Nonalcoholic. - Father had prostate cancer.  Maternal cousin had breast cancer.  Maternal aunt had lung and breast cancers   PLAN SUMMARY: >> Labs only every 3 months = CBC/D >> Labs in 1 year = CBC/D, B12, MMA, ferritin, iron/TIBC >> OFFICE visit in 1 year (1 week after labs)     REVIEW OF SYSTEMS:   Review of Systems  Constitutional:  Positive for fatigue. Negative for appetite change, chills, diaphoresis, fever and unexpected weight change.  HENT:   Negative for lump/mass and nosebleeds.   Eyes:  Negative for eye problems.  Respiratory:  Negative for cough, hemoptysis and shortness of breath.   Cardiovascular:  Negative for chest pain, leg swelling and palpitations.  Gastrointestinal:  Negative for abdominal pain, blood in stool, constipation, diarrhea, nausea and vomiting.  Genitourinary:  Negative for hematuria.   Skin: Negative.   Neurological:  Positive for dizziness.  Negative for headaches and light-headedness.  Hematological:  Does not bruise/bleed easily.     PHYSICAL EXAM:  ECOG PERFORMANCE STATUS: 1 - Symptomatic but completely ambulatory  Vitals:   02/04/23 1307  BP: (!) 152/66  Pulse: 69  Resp: 16  Temp: 98 F (36.7 C)  SpO2: 99%   Filed Weights    02/04/23 1307  Weight: 201 lb 1 oz (91.2 kg)   Physical Exam Constitutional:      Appearance: Normal appearance. She is obese.  Cardiovascular:     Heart sounds: Normal heart sounds.  Pulmonary:     Breath sounds: Normal breath sounds.  Neurological:     General: No focal deficit present.     Mental Status: Mental status is at baseline.  Psychiatric:        Behavior: Behavior normal. Behavior is cooperative.     PAST MEDICAL/SURGICAL HISTORY:  No past medical history on file. Past Surgical History:  Procedure Laterality Date   HERNIA REPAIR     ROTATOR CUFF REPAIR     THYROID SURGERY     TONSILLECTOMY      SOCIAL HISTORY:  Social History   Socioeconomic History   Marital status: Single    Spouse name: Not on file   Number of children: Not on file   Years of education: Not on file   Highest education level: Not on file  Occupational History   Not on file  Tobacco Use   Smoking status: Never   Smokeless tobacco: Never  Substance and Sexual Activity   Alcohol use: No   Drug use: No   Sexual activity: Never  Other Topics Concern   Not on file  Social History Narrative   Not on file   Social Determinants of Health   Financial Resource Strain: High Risk (07/22/2022)   Overall Financial Resource Strain (CARDIA)    Difficulty of Paying Living Expenses: Very hard  Food Insecurity: Food Insecurity Present (07/04/2022)   Hunger Vital Sign    Worried About Running Out of Food in the Last Year: Never true    Ran Out of Food in the Last Year: Sometimes true  Transportation Needs: No Transportation Needs (07/04/2022)   PRAPARE - Administrator, Civil Service (Medical): No    Lack of Transportation (Non-Medical): No  Physical Activity: Inactive (11/16/2017)   [facility], [facility]   Exercise Vital Sign    Days of Exercise per Week: 0 days    Minutes of Exercise per Session: 0 min  Stress: Not on file  Social Connections: Not on file   Intimate Partner Violence: Not At Risk (07/04/2022)   Humiliation, Afraid, Rape, and Kick questionnaire    Fear of Current or Ex-Partner: No    Emotionally Abused: No    Physically Abused: No    Sexually Abused: No    FAMILY HISTORY:  No family history on file.  CURRENT MEDICATIONS:  Outpatient Encounter Medications as of 02/04/2023  Medication Sig   ferrous sulfate 324 (65 Fe) MG TBEC Take 1 tablet by mouth daily.   ibuprofen (ADVIL,MOTRIN) 400 MG tablet Take 1 tablet (400 mg total) by mouth every 6 (six) hours as needed.   norethindrone (MICRONOR) 0.35 MG tablet Take 1 tablet by mouth daily.   promethazine-dextromethorphan (PROMETHAZINE-DM) 6.25-15 MG/5ML syrup Take 5 mLs by mouth 4 (four) times daily as needed for cough.   Vitamin D, Ergocalciferol, (DRISDOL) 1.25 MG (50000 UNIT) CAPS capsule Take 50,000 Units  by mouth once a week.   [DISCONTINUED] levothyroxine (SYNTHROID, LEVOTHROID) 200 MCG tablet Take 200 mcg by mouth daily.   levothyroxine (SYNTHROID) 175 MCG tablet Take 175 mcg by mouth every morning.   No facility-administered encounter medications on file as of 02/04/2023.    ALLERGIES:  No Known Allergies  LABORATORY DATA:  I have reviewed the labs as listed.  CBC    Component Value Date/Time   WBC 2.6 (L) 01/28/2023 1255   RBC 4.12 01/28/2023 1255   HGB 12.6 01/28/2023 1255   HCT 38.9 01/28/2023 1255   PLT 224 01/28/2023 1255   MCV 94.4 01/28/2023 1255   MCH 30.6 01/28/2023 1255   MCHC 32.4 01/28/2023 1255   RDW 13.9 01/28/2023 1255   LYMPHSABS 0.9 01/28/2023 1255   MONOABS 0.2 01/28/2023 1255   EOSABS 0.1 01/28/2023 1255   BASOSABS 0.0 01/28/2023 1255      Latest Ref Rng & Units 12/06/2017   10:02 PM 12/28/2015    3:19 PM  CMP  Glucose 70 - 99 mg/dL 846  84   BUN 6 - 20 mg/dL 12  15   Creatinine 9.62 - 1.00 mg/dL 9.52  8.41   Sodium 324 - 145 mmol/L 136  136   Potassium 3.5 - 5.1 mmol/L 3.6  3.5   Chloride 98 - 111 mmol/L 105  107   CO2 22 - 32  mmol/L 23  24   Calcium 8.9 - 10.3 mg/dL 8.8  8.9   Total Protein 6.5 - 8.1 g/dL 7.0    Total Bilirubin 0.3 - 1.2 mg/dL 0.7    Alkaline Phos 38 - 126 U/L 91    AST 15 - 41 U/L 14    ALT 0 - 44 U/L 9      DIAGNOSTIC IMAGING:  I have independently reviewed the relevant imaging and discussed with the patient.   WRAP UP:  All questions were answered. The patient knows to call the clinic with any problems, questions or concerns.  Medical decision making: Low  Time spent on visit: I spent 15 minutes counseling the patient face to face. The total time spent in the appointment was 22 minutes and more than 50% was on counseling.  Carnella Guadalajara, PA-C  02/04/2023 1:36 PM

## 2023-02-04 ENCOUNTER — Inpatient Hospital Stay: Payer: No Typology Code available for payment source | Attending: Physician Assistant | Admitting: Physician Assistant

## 2023-02-04 VITALS — BP 152/66 | HR 69 | Temp 98.0°F | Resp 16 | Wt 201.1 lb

## 2023-02-04 DIAGNOSIS — D72819 Decreased white blood cell count, unspecified: Secondary | ICD-10-CM | POA: Diagnosis present

## 2023-02-04 DIAGNOSIS — E611 Iron deficiency: Secondary | ICD-10-CM

## 2023-02-04 DIAGNOSIS — E538 Deficiency of other specified B group vitamins: Secondary | ICD-10-CM

## 2023-02-04 DIAGNOSIS — D708 Other neutropenia: Secondary | ICD-10-CM | POA: Diagnosis not present

## 2023-02-04 NOTE — Patient Instructions (Signed)
[facility] at [facility] **VISIT SUMMARY & IMPORTANT INSTRUCTIONS **   You were seen today by Rojelio Brenner PA-C for your low white blood cells.    LOW WHITE BLOOD CELLS: Your white blood cells have always been mildly low.  This may just be "your normal." You do have some mildly low vitamin B12.  I recommend that you start taking vitamin B12 500 mcg every other day.  (Available over-the-counter).  LABS: We will check your blood count once every 3 months to get a better idea of your baseline  FOLLOW-UP APPOINTMENT: Office visit in 1 year  ** Thank you for trusting me with your healthcare!  I strive to provide all of my patients with quality care at each visit.  If you receive a survey for this visit, I would be so grateful to you for taking the time to provide feedback.  Thank you in advance!  ~ Aracelie Addis                   Dr. Doreatha Massed   &   Rojelio Brenner, PA-C   - - - - - - - - - - - - - - - - - -    Thank you for choosing [facility] at [facility] to provide your oncology and hematology care.  To afford each patient quality time with our provider, please arrive at least 15 minutes before your scheduled appointment time.   If you have a lab appointment with the Cancer Center please come in thru the Main Entrance and check in at the main information desk.  You need to re-schedule your appointment should you arrive 10 or more minutes late.  We strive to give you quality time with our providers, and arriving late affects you and other patients whose appointments are after yours.  Also, if you no show three or more times for appointments you may be dismissed from the clinic at the providers discretion.     Again, thank you for choosing [facility].  Our hope is that these requests will decrease the amount of time that you wait before being seen by our physicians.        _____________________________________________________________  Should you have questions after your visit to [facility], please contact our office at [phone] and follow the prompts.  Our office hours are 8:00 a.m. and 4:30 p.m. Monday - Friday.  Please note that voicemails left after 4:00 p.m. may not be returned until the following business day.  We are closed weekends and major holidays.  You do have access to a nurse 24-7, just call the main number to the clinic [phone] and do not press any options, hold on the line and a nurse will answer the phone.    For prescription refill requests, have your pharmacy contact our office and allow 72 hours.

## 2023-02-11 ENCOUNTER — Encounter (HOSPITAL_COMMUNITY): Payer: Self-pay | Admitting: Emergency Medicine

## 2023-02-11 ENCOUNTER — Emergency Department (HOSPITAL_COMMUNITY)
Admission: EM | Admit: 2023-02-11 | Discharge: 2023-02-11 | Disposition: A | Discharge status: Home / Self Care | Payer: No Typology Code available for payment source | Attending: Emergency Medicine | Admitting: Emergency Medicine

## 2023-02-11 ENCOUNTER — Other Ambulatory Visit: Payer: Self-pay

## 2023-02-11 ENCOUNTER — Emergency Department (HOSPITAL_COMMUNITY): Payer: No Typology Code available for payment source

## 2023-02-11 DIAGNOSIS — Y92410 Unspecified street and highway as the place of occurrence of the external cause: Secondary | ICD-10-CM | POA: Diagnosis not present

## 2023-02-11 DIAGNOSIS — S8002XA Contusion of left knee, initial encounter: Secondary | ICD-10-CM | POA: Insufficient documentation

## 2023-02-11 DIAGNOSIS — M25562 Pain in left knee: Secondary | ICD-10-CM | POA: Diagnosis present

## 2023-02-11 MED ORDER — OXYCODONE-ACETAMINOPHEN 5-325 MG PO TABS
1.0000 | ORAL_TABLET | Freq: Once | ORAL | Status: AC
  Administered 2023-02-11: 1 via ORAL
  Filled 2023-02-11: qty 1
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
## 2023-02-11 NOTE — ED Notes (Signed)
Reviewed D/C information with the patient, pt verbalized understanding. No additional concerns at this time.

## 2023-05-06 ENCOUNTER — Inpatient Hospital Stay: Payer: No Typology Code available for payment source | Attending: Hematology

## 2023-05-06 DIAGNOSIS — D72819 Decreased white blood cell count, unspecified: Secondary | ICD-10-CM | POA: Diagnosis present

## 2023-05-06 DIAGNOSIS — E538 Deficiency of other specified B group vitamins: Secondary | ICD-10-CM

## 2023-05-06 DIAGNOSIS — E611 Iron deficiency: Secondary | ICD-10-CM

## 2023-05-06 DIAGNOSIS — D708 Other neutropenia: Secondary | ICD-10-CM

## 2023-05-06 LAB — CBC WITH DIFFERENTIAL/PLATELET
Abs Immature Granulocytes: 0 10*3/uL (ref 0.00–0.07)
Basophils Absolute: 0 10*3/uL (ref 0.0–0.1)
Basophils Relative: 1 %
Eosinophils Absolute: 0.1 10*3/uL (ref 0.0–0.5)
Eosinophils Relative: 4 %
HCT: 36 % (ref 36.0–46.0)
Hemoglobin: 11.8 g/dL — ABNORMAL LOW (ref 12.0–15.0)
Immature Granulocytes: 0 %
Lymphocytes Relative: 40 %
Lymphs Abs: 1.1 10*3/uL (ref 0.7–4.0)
MCH: 30.9 pg (ref 26.0–34.0)
MCHC: 32.8 g/dL (ref 30.0–36.0)
MCV: 94.2 fL (ref 80.0–100.0)
Monocytes Absolute: 0.3 10*3/uL (ref 0.1–1.0)
Monocytes Relative: 10 %
Neutro Abs: 1.2 10*3/uL — ABNORMAL LOW (ref 1.7–7.7)
Neutrophils Relative %: 45 %
Platelets: 248 10*3/uL (ref 150–400)
RBC: 3.82 MIL/uL — ABNORMAL LOW (ref 3.87–5.11)
RDW: 13.8 % (ref 11.5–15.5)
WBC: 2.7 10*3/uL — ABNORMAL LOW (ref 4.0–10.5)
nRBC: 0 % (ref 0.0–0.2)

## 2023-08-05 ENCOUNTER — Inpatient Hospital Stay: Payer: No Typology Code available for payment source | Attending: Hematology

## 2023-08-05 DIAGNOSIS — E611 Iron deficiency: Secondary | ICD-10-CM

## 2023-08-05 DIAGNOSIS — D708 Other neutropenia: Secondary | ICD-10-CM

## 2023-08-05 DIAGNOSIS — D72819 Decreased white blood cell count, unspecified: Secondary | ICD-10-CM | POA: Insufficient documentation

## 2023-08-05 DIAGNOSIS — E538 Deficiency of other specified B group vitamins: Secondary | ICD-10-CM

## 2023-08-05 LAB — CBC WITH DIFFERENTIAL/PLATELET
Abs Immature Granulocytes: 0.01 10*3/uL (ref 0.00–0.07)
Basophils Absolute: 0 10*3/uL (ref 0.0–0.1)
Basophils Relative: 1 %
Eosinophils Absolute: 0.1 10*3/uL (ref 0.0–0.5)
Eosinophils Relative: 2 %
HCT: 36.6 % (ref 36.0–46.0)
Hemoglobin: 11.8 g/dL — ABNORMAL LOW (ref 12.0–15.0)
Immature Granulocytes: 0 %
Lymphocytes Relative: 39 %
Lymphs Abs: 1.2 10*3/uL (ref 0.7–4.0)
MCH: 30.2 pg (ref 26.0–34.0)
MCHC: 32.2 g/dL (ref 30.0–36.0)
MCV: 93.6 fL (ref 80.0–100.0)
Monocytes Absolute: 0.3 10*3/uL (ref 0.1–1.0)
Monocytes Relative: 10 %
Neutro Abs: 1.4 10*3/uL — ABNORMAL LOW (ref 1.7–7.7)
Neutrophils Relative %: 48 %
Platelets: 237 10*3/uL (ref 150–400)
RBC: 3.91 MIL/uL (ref 3.87–5.11)
RDW: 13.4 % (ref 11.5–15.5)
WBC: 3 10*3/uL — ABNORMAL LOW (ref 4.0–10.5)
nRBC: 0 % (ref 0.0–0.2)

## 2023-11-04 ENCOUNTER — Inpatient Hospital Stay: Payer: No Typology Code available for payment source | Attending: Hematology

## 2023-11-04 DIAGNOSIS — D72819 Decreased white blood cell count, unspecified: Secondary | ICD-10-CM | POA: Diagnosis present

## 2023-11-04 DIAGNOSIS — E611 Iron deficiency: Secondary | ICD-10-CM

## 2023-11-04 DIAGNOSIS — D708 Other neutropenia: Secondary | ICD-10-CM

## 2023-11-04 DIAGNOSIS — E538 Deficiency of other specified B group vitamins: Secondary | ICD-10-CM

## 2023-11-04 LAB — CBC WITH DIFFERENTIAL/PLATELET
Abs Immature Granulocytes: 0.02 K/uL (ref 0.00–0.07)
Basophils Absolute: 0 K/uL (ref 0.0–0.1)
Basophils Relative: 1 %
Eosinophils Absolute: 0.3 K/uL (ref 0.0–0.5)
Eosinophils Relative: 8 %
HCT: 36.4 % (ref 36.0–46.0)
Hemoglobin: 12 g/dL (ref 12.0–15.0)
Immature Granulocytes: 1 %
Lymphocytes Relative: 39 %
Lymphs Abs: 1.3 K/uL (ref 0.7–4.0)
MCH: 31.1 pg (ref 26.0–34.0)
MCHC: 33 g/dL (ref 30.0–36.0)
MCV: 94.3 fL (ref 80.0–100.0)
Monocytes Absolute: 0.3 K/uL (ref 0.1–1.0)
Monocytes Relative: 10 %
Neutro Abs: 1.3 K/uL — ABNORMAL LOW (ref 1.7–7.7)
Neutrophils Relative %: 41 %
Platelets: 257 K/uL (ref 150–400)
RBC: 3.86 MIL/uL — ABNORMAL LOW (ref 3.87–5.11)
RDW: 13.3 % (ref 11.5–15.5)
WBC: 3.2 K/uL — ABNORMAL LOW (ref 4.0–10.5)
nRBC: 0 % (ref 0.0–0.2)

## 2023-12-21 ENCOUNTER — Other Ambulatory Visit (HOSPITAL_COMMUNITY): Payer: Self-pay | Admitting: Family Medicine

## 2023-12-21 DIAGNOSIS — Z1231 Encounter for screening mammogram for malignant neoplasm of breast: Secondary | ICD-10-CM

## 2024-02-03 ENCOUNTER — Inpatient Hospital Stay: Payer: No Typology Code available for payment source | Attending: Hematology

## 2024-02-03 DIAGNOSIS — E538 Deficiency of other specified B group vitamins: Secondary | ICD-10-CM

## 2024-02-03 DIAGNOSIS — D708 Other neutropenia: Secondary | ICD-10-CM

## 2024-02-03 DIAGNOSIS — D709 Neutropenia, unspecified: Secondary | ICD-10-CM | POA: Insufficient documentation

## 2024-02-03 DIAGNOSIS — E611 Iron deficiency: Secondary | ICD-10-CM

## 2024-02-03 LAB — CBC WITH DIFFERENTIAL/PLATELET
Abs Immature Granulocytes: 0 K/uL (ref 0.00–0.07)
Basophils Absolute: 0 K/uL (ref 0.0–0.1)
Basophils Relative: 1 %
Eosinophils Absolute: 0.1 K/uL (ref 0.0–0.5)
Eosinophils Relative: 3 %
HCT: 36.6 % (ref 36.0–46.0)
Hemoglobin: 11.8 g/dL — ABNORMAL LOW (ref 12.0–15.0)
Immature Granulocytes: 0 %
Lymphocytes Relative: 41 %
Lymphs Abs: 1.3 K/uL (ref 0.7–4.0)
MCH: 30.8 pg (ref 26.0–34.0)
MCHC: 32.2 g/dL (ref 30.0–36.0)
MCV: 95.6 fL (ref 80.0–100.0)
Monocytes Absolute: 0.2 K/uL (ref 0.1–1.0)
Monocytes Relative: 7 %
Neutro Abs: 1.5 K/uL — ABNORMAL LOW (ref 1.7–7.7)
Neutrophils Relative %: 48 %
Platelets: 244 K/uL (ref 150–400)
RBC: 3.83 MIL/uL — ABNORMAL LOW (ref 3.87–5.11)
RDW: 13.5 % (ref 11.5–15.5)
WBC: 3.1 K/uL — ABNORMAL LOW (ref 4.0–10.5)
nRBC: 0 % (ref 0.0–0.2)

## 2024-02-03 LAB — IRON AND TIBC
Iron: 51 ug/dL (ref 28–170)
Saturation Ratios: 15 % (ref 10.4–31.8)
TIBC: 337 ug/dL (ref 250–450)
UIBC: 286 ug/dL

## 2024-02-03 LAB — FERRITIN: Ferritin: 96 ng/mL (ref 11–307)

## 2024-02-03 LAB — VITAMIN B12: Vitamin B-12: 488 pg/mL (ref 180–914)

## 2024-02-06 LAB — METHYLMALONIC ACID, SERUM: Methylmalonic Acid, Quantitative: 290 nmol/L (ref 0–378)

## 2024-02-09 NOTE — Progress Notes (Unsigned)
 [facility] [address], [place] [address]   CLINIC:  Medical Oncology/Hematology  PCP:  Michel Landry LABOR, NP No address on file None   REASON FOR VISIT:  Follow-up for leukopenia  PRIOR THERAPY: None  CURRENT THERAPY: Surveillance  INTERVAL HISTORY:   Carolyn Huang 58 y.o. female returns for routine follow-up of leukopenia.   She was last seen by Pleasant Barefoot, PA-C on 02/04/2023.  At today's visit, she reports feeling ***.   ***No recent hospitalizations, surgeries, or changes in baseline health status.   She has 70***% energy and 60***% appetite.  ***She endorses that she is maintaining a stable weight. ***She denies any frequent infections, B symptoms, masses, or lymphadenopathy.   ***She does report some mild joint stiffness in the mornings that lasts less than 15 minutes.   ***She denies any abnormal arthralgias or rashes. ***She is taking vitamin B12 500 mcg every other day as well as iron tablet every other day. ***Bleeding?  *** ***Fatigue or pica?  ASSESSMENT & PLAN:  1.  Neutropenia: - Patient seen at the request of Silvio Ramp, NP - Patient shows low or low-normal WBCs on limited prior lab data, ranging from 2.6 to 5.5 since 2017 - No anemia or thrombocytopenia - CT AP (12/07/2017): Spleen normal size. - Hematology workup: ANA positive.  Rheumatoid factor negative.  (Referred to rheumatology in May 2024 due to ANA positivity.) Normal copper , folate, B12 SPEP unremarkable.  Normal LDH and reticulocytes. Marginal B12 at 355, elevated MMA 433. - She is taking vitamin B12 500 mcg every other day*** - She denies any recurrent infections, B symptoms, masses, or lymphadenopathy*** - WBC trend over the past year: (01/28/2023): WBC 2.6, ANC 1.4 (05/06/2023): WBC 2.7, ANC 1.2 (08/05/2023): WBC 3.0, ANC 1.4 (11/04/2023): WBC 3.2, ANC 1.3 (02/03/2024): WBC 3.1, ANC 1.5 - Additional labs (02/03/2024): Normal vitamin B12 488, MMA normalized. -  DIFFERENTIAL DIAGNOSIS favors benign ethnic neutropenia, but also includes leukopenia related to mild B12 deficiency or underlying pro inflammatory state (ANA positive). - PLAN: No concern for malignant leukopenia at this time.  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  She is stable for PCP follow-up, but should return if she has WBC <2.0 or ANC <1.0 on 2 consecutive readings. -- Continue vitamin B12 500 mcg every other day  2.  History of iron deficiency anemia - Patient reports that she has history of low iron related to menorrhagia and previously received iron infusions in [place] - She is currently on a pill that makes her periods lighter - She denies any rectal bleeding or melena, but does have some fatigue*** - She is taking iron tablet every other day.  *** - Most recent labs (02/03/2024): Hgb 11.8/MCV 95.6, ferritin 96, iron saturation 15% - PLAN: *** Continue iron tablet every other day.  Stable for PCP follow-up.  *** Can return if she has any severe iron deficiency or anemia that is refractory to oral iron supplementation.  ***  3.  Social/family history: - Seen today with her youngest daughter.  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  Non-smoker.  Nonalcoholic. - Father had prostate cancer.  Maternal cousin had breast cancer.  Maternal aunt had lung and breast cancers   PLAN SUMMARY: >> Discharge to PCP ***     REVIEW OF SYSTEMS: ***  Review of Systems  Constitutional:  Positive for fatigue. Negative for appetite change, chills, diaphoresis, fever and unexpected weight change.  HENT:   Negative for lump/mass and nosebleeds.   Eyes:  Negative for eye problems.  Respiratory:  Negative for cough, hemoptysis and shortness of breath.   Cardiovascular:  Negative for chest pain, leg swelling and palpitations.  Gastrointestinal:  Negative for abdominal pain, blood in stool, constipation, diarrhea,  nausea and vomiting.  Genitourinary:  Negative for hematuria.   Skin: Negative.   Neurological:  Positive for dizziness. Negative for headaches and light-headedness.  Hematological:  Does not bruise/bleed easily.     PHYSICAL EXAM:***  ECOG PERFORMANCE STATUS: 1 - Symptomatic but completely ambulatory  There were no vitals filed for this visit.  There were no vitals filed for this visit.  Physical Exam Constitutional:      Appearance: Normal appearance. She is obese.  Cardiovascular:     Heart sounds: Normal heart sounds.  Pulmonary:     Breath sounds: Normal breath sounds.  Neurological:     General: No focal deficit present.     Mental Status: Mental status is at baseline.  Psychiatric:        Behavior: Behavior normal. Behavior is cooperative.     PAST MEDICAL/SURGICAL HISTORY:  No past medical history on file. Past Surgical History:  Procedure Laterality Date   HERNIA REPAIR     ROTATOR CUFF REPAIR     THYROID SURGERY     TONSILLECTOMY      SOCIAL HISTORY:  Social History   Socioeconomic History   Marital status: Single    Spouse name: Not on file   Number of children: Not on file   Years of education: Not on file   Highest education level: Not on file  Occupational History   Not on file  Tobacco Use   Smoking status: Never   Smokeless tobacco: Never  Substance and Sexual Activity   Alcohol use: No   Drug use: No   Sexual activity: Never  Other Topics Concern   Not on file  Social History Narrative   Not on file   Social Drivers of Health   Financial Resource Strain: High Risk (07/22/2022)   Overall Financial Resource Strain (CARDIA)    Difficulty of Paying Living Expenses: Very hard  Food Insecurity: Food Insecurity Present (07/04/2022)   Hunger Vital Sign    Worried About Running Out of Food in the Last Year: Never true    Ran Out of Food in the Last Year: Sometimes true  Transportation Needs: No Transportation Needs (07/04/2022)   PRAPARE -  Administrator, Civil Service (Medical): No    Lack of Transportation (Non-Medical): No  Physical Activity: Inactive (11/16/2017)   [facility] from [facility]   Exercise Vital Sign    Days of Exercise per Week: 0 days    Minutes of Exercise per Session: 0 min  Stress: Not on file  Social Connections: Not on file  Intimate Partner Violence: Not At Risk (07/04/2022)   Humiliation, Afraid, Rape, and Kick questionnaire    Fear of Current or Ex-Partner: No    Emotionally Abused: No    Physically Abused: No    Sexually Abused: No    FAMILY HISTORY:  No family history on file.  CURRENT MEDICATIONS:  Outpatient Encounter Medications as of 02/10/2024  Medication Sig   ferrous sulfate 324 (65 Fe) MG TBEC Take 1 tablet by mouth daily.   ibuprofen  (ADVIL ,MOTRIN ) 400 MG tablet Take 1 tablet (400 mg total) by mouth every 6 (six) hours as needed.   levothyroxine (SYNTHROID)  175 MCG tablet Take 175 mcg by mouth every morning.   norethindrone (MICRONOR) 0.35 MG tablet Take 1 tablet by mouth daily.   promethazine -dextromethorphan (PROMETHAZINE -DM) 6.25-15 MG/5ML syrup Take 5 mLs by mouth 4 (four) times daily as needed for cough.   Vitamin D, Ergocalciferol, (DRISDOL) 1.25 MG (50000 UNIT) CAPS capsule Take 50,000 Units by mouth once a week.   No facility-administered encounter medications on file as of 02/10/2024.    ALLERGIES:  No Known Allergies  LABORATORY DATA:  I have reviewed the labs as listed.  CBC    Component Value Date/Time   WBC 3.1 (L) 02/03/2024 1335   RBC 3.83 (L) 02/03/2024 1335   HGB 11.8 (L) 02/03/2024 1335   HCT 36.6 02/03/2024 1335   PLT 244 02/03/2024 1335   MCV 95.6 02/03/2024 1335   MCH 30.8 02/03/2024 1335   MCHC 32.2 02/03/2024 1335   RDW 13.5 02/03/2024 1335   LYMPHSABS 1.3 02/03/2024 1335   MONOABS 0.2 02/03/2024 1335   EOSABS 0.1 02/03/2024 1335   BASOSABS 0.0 02/03/2024 1335      Latest Ref Rng & Units 12/06/2017   10:02 PM 12/28/2015     3:19 PM  CMP  Glucose 70 - 99 mg/dL 884  84   BUN 6 - 20 mg/dL 12  15   Creatinine 9.55 - 1.00 mg/dL 9.08  9.11   Sodium 864 - 145 mmol/L 136  136   Potassium 3.5 - 5.1 mmol/L 3.6  3.5   Chloride 98 - 111 mmol/L 105  107   CO2 22 - 32 mmol/L 23  24   Calcium 8.9 - 10.3 mg/dL 8.8  8.9   Total Protein 6.5 - 8.1 g/dL 7.0    Total Bilirubin 0.3 - 1.2 mg/dL 0.7    Alkaline Phos 38 - 126 U/L 91    AST 15 - 41 U/L 14    ALT 0 - 44 U/L 9      DIAGNOSTIC IMAGING:  I have independently reviewed the relevant imaging and discussed with the patient.   WRAP UP:  All questions were answered. The patient knows to call the clinic with any problems, questions or concerns.  Medical decision making: Low***  Time spent on visit: I spent 15 minutes counseling the patient face to face. The total time spent in the appointment was 22 minutes and more than 50% was on counseling.  Pleasant CHRISTELLA Barefoot, PA-C  ***

## 2024-02-10 ENCOUNTER — Inpatient Hospital Stay: Payer: No Typology Code available for payment source | Admitting: Physician Assistant

## 2024-02-10 VITALS — BP 151/66 | HR 55 | Temp 99.6°F | Resp 18 | Ht 63.0 in | Wt 197.0 lb

## 2024-02-10 DIAGNOSIS — D708 Other neutropenia: Secondary | ICD-10-CM | POA: Diagnosis not present

## 2024-02-10 DIAGNOSIS — E538 Deficiency of other specified B group vitamins: Secondary | ICD-10-CM | POA: Diagnosis not present

## 2024-02-10 DIAGNOSIS — E611 Iron deficiency: Secondary | ICD-10-CM

## 2024-02-10 DIAGNOSIS — D709 Neutropenia, unspecified: Secondary | ICD-10-CM | POA: Diagnosis not present

## 2024-02-10 NOTE — Patient Instructions (Signed)
 [facility] at [facility] **VISIT SUMMARY & IMPORTANT INSTRUCTIONS **   You were seen today by Pleasant Barefoot PA-C for your low white blood cells.    LOW WHITE BLOOD CELLS: Your white blood cells have always been mildly low.  This may just be your normal.  You do not need any additional labs or follow-up visits here at the [facility].  You should continue to have your labs checked at least once a year by your primary care doctor. If you have any worsening of your white blood cells (white blood cells <2.0, or absolute neutrophils <1.0) that is persistent for more than 1 month, then you should be referred back to us  for another visit.  Otherwise, your primary care doctor can take over surveillance of your blood counts!   I recommend that you restart iron tablet and take this 3 times each week (Monday Wednesday Friday). The goal is to maintain ferritin >100.  (If your ferritin is >200, then you can stop taking iron pills).   You should also take vitamin B12 500 mcg 3 times weekly (Monday Wednesday Friday). The goal is to maintain vitamin B12 levels >400.  (If your vitamin B12 level is >1200, then you can stop taking vitamin B12)   ** Thank you for trusting me with your healthcare!  I strive to provide all of my patients with quality care at each visit.  If you receive a survey for this visit, I would be so grateful to you for taking the time to provide feedback.  Thank you in advance!  ~ Armon Orvis                                        Dr. Mickiel Davonna Pleasant Barefoot, PA-C       Delon Hope, NP   - - - - - - - - - - - - - - - - - -     Thank you for choosing  Cancer Center at [facility] to provide your oncology and hematology care.  To afford each patient quality time with our provider, please arrive at least 15 minutes before your scheduled appointment time.   If you have a lab appointment with the Cancer Center please come in  thru the Main Entrance and check in at the main information desk.  You need to re-schedule your appointment should you arrive 10 or more minutes late.  We strive to give you quality time with our providers, and arriving late affects you and other patients whose appointments are after yours.  Also, if you no show three or more times for appointments you may be dismissed from the clinic at the providers discretion.     Again, thank you for choosing [facility].  Our hope is that these requests will decrease the amount of time that you wait before being seen by our physicians.       _____________________________________________________________  Should you have questions after your visit to [facility], please contact our office at [phone] and follow the prompts.  Our office hours are 8:00 a.m. and 4:30 p.m. Monday - Friday.  Please note that voicemails left after 4:00 p.m. may not be returned until the following business day.  We are closed weekends and major holidays.  You do have access to a nurse  24-7, just call the main number to the clinic [phone] and do not press any options, hold on the line and a nurse will answer the phone.    For prescription refill requests, have your pharmacy contact our office and allow 72 hours.

## 2024-02-29 ENCOUNTER — Encounter: Payer: Self-pay | Admitting: *Deleted

## 2024-03-24 ENCOUNTER — Ambulatory Visit (HOSPITAL_COMMUNITY)
Admission: RE | Admit: 2024-03-24 | Discharge: 2024-03-24 | Disposition: A | Discharge status: Home / Self Care | Source: Ambulatory Visit | Attending: Family Medicine | Admitting: Family Medicine

## 2024-03-24 ENCOUNTER — Encounter (HOSPITAL_COMMUNITY): Payer: Self-pay | Admitting: Radiology

## 2024-03-24 DIAGNOSIS — Z1231 Encounter for screening mammogram for malignant neoplasm of breast: Secondary | ICD-10-CM | POA: Diagnosis present
